# Patient Record
Sex: Male | Born: 1985 | Hispanic: Yes | Marital: Single | Smoking: Former smoker
Health system: Southern US, Community
[De-identification: ages and names within clinical notes are randomized; demographics above are authoritative.]

## PROBLEM LIST (undated history)

## (undated) DIAGNOSIS — R440 Auditory hallucinations: Secondary | ICD-10-CM

## (undated) HISTORY — PX: BACK SURGERY: SHX140

---

## 2014-11-10 HISTORY — PX: FRACTURE SURGERY: SHX138

## 2015-10-05 ENCOUNTER — Encounter (HOSPITAL_COMMUNITY)
Admission: EM | Disposition: A | Discharge status: Home / Self Care | Payer: Self-pay | Source: Home / Self Care | Attending: Orthopedic Surgery

## 2015-10-05 ENCOUNTER — Emergency Department (HOSPITAL_COMMUNITY): Payer: Self-pay | Admitting: Certified Registered"

## 2015-10-05 ENCOUNTER — Inpatient Hospital Stay (HOSPITAL_COMMUNITY)
Admission: EM | Admit: 2015-10-05 | Discharge: 2015-10-16 | DRG: 580 | Disposition: A | Discharge status: Home / Self Care | Payer: Self-pay | Attending: Orthopedic Surgery | Admitting: Orthopedic Surgery

## 2015-10-05 ENCOUNTER — Emergency Department (HOSPITAL_COMMUNITY): Payer: Self-pay

## 2015-10-05 ENCOUNTER — Emergency Department
Admission: EM | Admit: 2015-10-05 | Discharge: 2015-10-05 | Disposition: A | Discharge status: Other Acute Inpatient Hospital | Payer: Self-pay | Attending: Emergency Medicine | Admitting: Emergency Medicine

## 2015-10-05 ENCOUNTER — Encounter (HOSPITAL_COMMUNITY): Payer: Self-pay

## 2015-10-05 DIAGNOSIS — S61519A Laceration without foreign body of unspecified wrist, initial encounter: Secondary | ICD-10-CM | POA: Diagnosis present

## 2015-10-05 DIAGNOSIS — S6421XA Injury of radial nerve at wrist and hand level of right arm, initial encounter: Secondary | ICD-10-CM | POA: Diagnosis not present

## 2015-10-05 DIAGNOSIS — Y9289 Other specified places as the place of occurrence of the external cause: Secondary | ICD-10-CM | POA: Insufficient documentation

## 2015-10-05 DIAGNOSIS — Y9339 Activity, other involving climbing, rappelling and jumping off: Secondary | ICD-10-CM | POA: Insufficient documentation

## 2015-10-05 DIAGNOSIS — Y998 Other external cause status: Secondary | ICD-10-CM | POA: Insufficient documentation

## 2015-10-05 DIAGNOSIS — F17200 Nicotine dependence, unspecified, uncomplicated: Secondary | ICD-10-CM | POA: Diagnosis present

## 2015-10-05 DIAGNOSIS — T1491XA Suicide attempt, initial encounter: Secondary | ICD-10-CM

## 2015-10-05 DIAGNOSIS — W14XXXA Fall from tree, initial encounter: Secondary | ICD-10-CM | POA: Insufficient documentation

## 2015-10-05 DIAGNOSIS — R45851 Suicidal ideations: Secondary | ICD-10-CM

## 2015-10-05 DIAGNOSIS — F29 Unspecified psychosis not due to a substance or known physiological condition: Secondary | ICD-10-CM | POA: Insufficient documentation

## 2015-10-05 DIAGNOSIS — Z23 Encounter for immunization: Secondary | ICD-10-CM | POA: Insufficient documentation

## 2015-10-05 DIAGNOSIS — F333 Major depressive disorder, recurrent, severe with psychotic symptoms: Secondary | ICD-10-CM

## 2015-10-05 DIAGNOSIS — R440 Auditory hallucinations: Secondary | ICD-10-CM | POA: Diagnosis present

## 2015-10-05 DIAGNOSIS — X789XXA Intentional self-harm by unspecified sharp object, initial encounter: Secondary | ICD-10-CM | POA: Diagnosis present

## 2015-10-05 DIAGNOSIS — S61511A Laceration without foreign body of right wrist, initial encounter: Secondary | ICD-10-CM

## 2015-10-05 DIAGNOSIS — T1491 Suicide attempt: Secondary | ICD-10-CM | POA: Diagnosis present

## 2015-10-05 DIAGNOSIS — Z590 Homelessness: Secondary | ICD-10-CM

## 2015-10-05 DIAGNOSIS — S66801D Unspecified injury of other specified muscles, fascia and tendons at wrist and hand level, right hand, subsequent encounter: Secondary | ICD-10-CM

## 2015-10-05 DIAGNOSIS — F323 Major depressive disorder, single episode, severe with psychotic features: Secondary | ICD-10-CM | POA: Diagnosis present

## 2015-10-05 HISTORY — PX: I & D EXTREMITY: SHX5045

## 2015-10-05 HISTORY — DX: Auditory hallucinations: R44.0

## 2015-10-05 LAB — CBC
HCT: 45.5 %
HEMOGLOBIN: 15.1 g/dL
MCH: 27.8 pg
MCHC: 33.1 g/dL
MCV: 84 fL
PLATELETS: 242 10*3/uL
RBC: 5.42 MIL/uL
RDW: 15.1 %
WBC: 9.8 10*3/uL

## 2015-10-05 LAB — COMPREHENSIVE METABOLIC PANEL
ALK PHOS: 139 U/L
ALT: 14 U/L
AST: 23 U/L
Albumin: 4.6 g/dL
Anion gap: 10
BUN: 9 mg/dL
CALCIUM: 9.5 mg/dL
CO2: 22 mmol/L
CREATININE: 0.82 mg/dL
Chloride: 105 mmol/L
Glucose, Bld: 143 mg/dL
Potassium: 3.9 mmol/L
Sodium: 137 mmol/L
Total Bilirubin: 0.6 mg/dL
Total Protein: 7.4 g/dL

## 2015-10-05 LAB — ETHANOL

## 2015-10-05 LAB — SALICYLATE LEVEL

## 2015-10-05 LAB — ACETAMINOPHEN LEVEL: Acetaminophen (Tylenol), Serum: <10 ug/mL

## 2015-10-05 SURGERY — IRRIGATION AND DEBRIDEMENT EXTREMITY
Anesthesia: General | Site: Wrist | Laterality: Right

## 2015-10-05 MED ORDER — CEFAZOLIN SODIUM-DEXTROSE 2-3 GM-% IV SOLR
INTRAVENOUS | Status: DC | PRN
  Administered 2015-10-05: 2 g via INTRAVENOUS

## 2015-10-05 MED ORDER — TETANUS-DIPHTHERIA TOXOIDS TD 5-2 LFU IM INJ
INJECTION | INTRAMUSCULAR | Status: AC
  Filled 2015-10-05: qty 0.5

## 2015-10-05 MED ORDER — PROPOFOL 10 MG/ML IV BOLUS
INTRAVENOUS | Status: DC | PRN
  Administered 2015-10-05: 160 mg via INTRAVENOUS
  Administered 2015-10-05: 40 mg via INTRAVENOUS
  Administered 2015-10-06: 50 mg via INTRAVENOUS

## 2015-10-05 MED ORDER — MIDAZOLAM HCL 5 MG/5ML IJ SOLN
INTRAMUSCULAR | Status: DC | PRN
  Administered 2015-10-05: 2 mg via INTRAVENOUS

## 2015-10-05 MED ORDER — LACTATED RINGERS IV SOLN
INTRAVENOUS | Status: DC | PRN
  Administered 2015-10-05 – 2015-10-06 (×2): via INTRAVENOUS

## 2015-10-05 MED ORDER — ONDANSETRON HCL 4 MG/2ML IJ SOLN
INTRAMUSCULAR | Status: AC
  Filled 2015-10-05: qty 2

## 2015-10-05 MED ORDER — LIDOCAINE HCL (PF) 1 % IJ SOLN
30.0000 mL | Freq: Once | INTRAMUSCULAR | Status: AC
  Administered 2015-10-05: 30 mL
  Filled 2015-10-05: qty 30

## 2015-10-05 MED ORDER — SUCCINYLCHOLINE CHLORIDE 20 MG/ML IJ SOLN
INTRAMUSCULAR | Status: DC | PRN
  Administered 2015-10-05: 40 mg via INTRAVENOUS

## 2015-10-05 MED ORDER — DEXAMETHASONE SODIUM PHOSPHATE 4 MG/ML IJ SOLN
INTRAMUSCULAR | Status: DC | PRN
  Administered 2015-10-05: 4 mg via INTRAVENOUS

## 2015-10-05 MED ORDER — LIDOCAINE HCL (CARDIAC) 20 MG/ML IV SOLN
INTRAVENOUS | Status: DC | PRN
  Administered 2015-10-05: 40 mg via INTRAVENOUS

## 2015-10-05 MED ORDER — LIDOCAINE HCL (PF) 1 % IJ SOLN
INTRAMUSCULAR | Status: AC
  Administered 2015-10-05: 5 mL
  Filled 2015-10-05: qty 10

## 2015-10-05 MED ORDER — MIDAZOLAM HCL 2 MG/2ML IJ SOLN
INTRAMUSCULAR | Status: AC
  Filled 2015-10-05: qty 2

## 2015-10-05 MED ORDER — SUFENTANIL CITRATE 50 MCG/ML IV SOLN
INTRAVENOUS | Status: DC | PRN
  Administered 2015-10-05: 15 ug via INTRAVENOUS

## 2015-10-05 MED ORDER — CEFAZOLIN SODIUM 1-5 GM-% IV SOLN
1.0000 g | Freq: Once | INTRAVENOUS | Status: AC
  Administered 2015-10-05: 1 g via INTRAVENOUS
  Filled 2015-10-05: qty 50

## 2015-10-05 MED ORDER — DEXAMETHASONE SODIUM PHOSPHATE 4 MG/ML IJ SOLN
INTRAMUSCULAR | Status: AC
  Filled 2015-10-05: qty 1

## 2015-10-05 MED ORDER — SUFENTANIL CITRATE 50 MCG/ML IV SOLN
INTRAVENOUS | Status: AC
  Filled 2015-10-05: qty 1

## 2015-10-05 MED ORDER — ONDANSETRON HCL 4 MG/2ML IJ SOLN
INTRAMUSCULAR | Status: DC | PRN
  Administered 2015-10-05: 4 mg via INTRAVENOUS

## 2015-10-05 MED ORDER — TETANUS-DIPHTH-ACELL PERTUSSIS 5-2.5-18.5 LF-MCG/0.5 IM SUSP
0.5000 mL | Freq: Once | INTRAMUSCULAR | Status: AC
  Administered 2015-10-05: 0.5 mL via INTRAMUSCULAR

## 2015-10-05 SURGICAL SUPPLY — 54 items
BANDAGE ELASTIC 4 VELCRO ST LF (GAUZE/BANDAGES/DRESSINGS) ×3 IMPLANT
BNDG CONFORM 2 STRL LF (GAUZE/BANDAGES/DRESSINGS) ×3 IMPLANT
BNDG GAUZE ELAST 4 BULKY (GAUZE/BANDAGES/DRESSINGS) ×3 IMPLANT
CANISTER SUCTION 2500CC (MISCELLANEOUS) ×6 IMPLANT
CONNECTOR NERVE AXOGUARD 2X15 (Orthopedic Implant) ×3 IMPLANT
CORDS BIPOLAR (ELECTRODE) ×3 IMPLANT
CUFF TOURNIQUET SINGLE 18IN (TOURNIQUET CUFF) ×3 IMPLANT
CUFF TOURNIQUET SINGLE 24IN (TOURNIQUET CUFF) IMPLANT
DRSG ADAPTIC 3X8 NADH LF (GAUZE/BANDAGES/DRESSINGS) ×3 IMPLANT
GAUZE SPONGE 4X4 12PLY STRL (GAUZE/BANDAGES/DRESSINGS) ×3 IMPLANT
GAUZE XEROFORM 1X8 LF (GAUZE/BANDAGES/DRESSINGS) IMPLANT
GAUZE XEROFORM 5X9 LF (GAUZE/BANDAGES/DRESSINGS) ×3 IMPLANT
GLOVE BIO SURGEON STRL SZ7.5 (GLOVE) ×3 IMPLANT
GLOVE BIOGEL M STRL SZ7.5 (GLOVE) ×6 IMPLANT
GLOVE SS BIOGEL STRL SZ 8 (GLOVE) ×3 IMPLANT
GLOVE SUPERSENSE BIOGEL SZ 8 (GLOVE) ×6
GOWN STRL REUS W/ TWL LRG LVL3 (GOWN DISPOSABLE) ×3 IMPLANT
GOWN STRL REUS W/ TWL XL LVL3 (GOWN DISPOSABLE) IMPLANT
GOWN STRL REUS W/TWL LRG LVL3 (GOWN DISPOSABLE) ×6
GOWN STRL REUS W/TWL XL LVL3 (GOWN DISPOSABLE)
HANDPIECE INTERPULSE COAX TIP (DISPOSABLE)
KIT BASIN OR (CUSTOM PROCEDURE TRAY) ×3 IMPLANT
KIT ROOM TURNOVER OR (KITS) ×3 IMPLANT
MANIFOLD NEPTUNE II (INSTRUMENTS) IMPLANT
NEEDLE HYPO 25GX1X1/2 BEV (NEEDLE) IMPLANT
NS IRRIG 1000ML POUR BTL (IV SOLUTION) ×3 IMPLANT
PACK ORTHO EXTREMITY (CUSTOM PROCEDURE TRAY) ×3 IMPLANT
PAD ARMBOARD 7.5X6 YLW CONV (MISCELLANEOUS) ×3 IMPLANT
PAD CAST 3X4 CTTN HI CHSV (CAST SUPPLIES) ×1 IMPLANT
PAD CAST 4YDX4 CTTN HI CHSV (CAST SUPPLIES) ×1 IMPLANT
PADDING CAST COTTON 3X4 STRL (CAST SUPPLIES) ×2
PADDING CAST COTTON 4X4 STRL (CAST SUPPLIES) ×2
SET CYSTO W/LG BORE CLAMP LF (SET/KITS/TRAYS/PACK) ×3 IMPLANT
SET HNDPC FAN SPRY TIP SCT (DISPOSABLE) IMPLANT
SPLINT FIBERGLASS 4X30 (CAST SUPPLIES) ×3 IMPLANT
SPONGE GAUZE 4X4 12PLY STER LF (GAUZE/BANDAGES/DRESSINGS) ×3 IMPLANT
SPONGE LAP 4X18 X RAY DECT (DISPOSABLE) ×3 IMPLANT
SUT FIBERWIRE 3-0 18 TAPR NDL (SUTURE) ×18
SUT PROLENE 4 0 PS 2 18 (SUTURE) ×3 IMPLANT
SUT PROLENE 5 0 PS 2 (SUTURE) ×9 IMPLANT
SUT PROLENE 6 0 CC (SUTURE) ×3 IMPLANT
SUT VIC AB 2-0 FS1 27 (SUTURE) ×3 IMPLANT
SUT VIC AB 4-0 P-3 18X BRD (SUTURE) ×1 IMPLANT
SUT VIC AB 4-0 P3 18 (SUTURE) ×2
SUTURE FIBERWR 3-0 18 TAPR NDL (SUTURE) ×6 IMPLANT
SYR CONTROL 10ML LL (SYRINGE) IMPLANT
SYSTEM CHEST DRAIN TLS 7FR (DRAIN) ×3 IMPLANT
TOWEL OR 17X24 6PK STRL BLUE (TOWEL DISPOSABLE) ×3 IMPLANT
TOWEL OR 17X26 10 PK STRL BLUE (TOWEL DISPOSABLE) ×3 IMPLANT
TUBE ANAEROBIC SPECIMEN COL (MISCELLANEOUS) IMPLANT
TUBE CONNECTING 12'X1/4 (SUCTIONS) ×1
TUBE CONNECTING 12X1/4 (SUCTIONS) ×2 IMPLANT
WATER STERILE IRR 1000ML POUR (IV SOLUTION) IMPLANT
YANKAUER SUCT BULB TIP NO VENT (SUCTIONS) IMPLANT

## 2015-10-05 NOTE — ED Notes (Signed)
Patient presents to the ED with large laceration across his right wrist currently bleeding.  Patient appears pale and diaphoretic.  Patient brought to the ED by couple who state they do not know patient but someone told them that he needed help, that he was homeless and that he intentionally cut himself with a saw.  Patient refused to give his name.  Patient alert, eyes open and shaking his head or nodding in answer to some questions.  Patient states he does not have a license or wallet on him.  Patient avoiding eye contact.

## 2015-10-05 NOTE — ED Notes (Signed)
Report called to eric rn, charge nurse at [facility].  Pt calm and cooperative.  Pt is IVC, transferred by [place] sheriff dept.  No urine sample obtained at [facility].  Pt was unable to void.  Iv was removed.

## 2015-10-05 NOTE — ED Notes (Signed)
Pt transfer from [facility] emergency department for self inflicted laceration to right arm. Pt is having thoughts of harming himself at this time,

## 2015-10-05 NOTE — H&P (Signed)
Stephen [name] is an 29 y.o. male.   Chief Complaint: Laceration right wrist HPI: Patient presented to laceration to the right wrist. He tried injured himself. He states he was depressed. He states he is now not depressed and wants to get his arm fixed. Mental health as being consult by the emergency room department. I do not see consult on the chart at this time.  I discussed all issues with patient at length. It appears this happened sometime ago. I was called around 9 PM in regards to this patient.  He denies other injury. He denies neck back chest or abdominal pain.  He takes no medicines.  He has no allergies  He is alert and oriented. He does not have family with him.    Past Medical History  Diagnosis Date  . Hearing voices     Pt does not know exactly what it is but he does hear voices    Past Surgical History  Procedure Laterality Date  . Fracture surgery Right 2016    right lower leg - August     No family history on file. Social History:  reports that he has been smoking.  He has never used smokeless tobacco. He reports that he does not drink alcohol or use illicit drugs.  Allergies: No Known Allergies   (Not in a hospital admission)  Results for orders placed or performed during the hospital encounter of 10/05/15 (from the past 48 hour(s))  Comprehensive metabolic panel     Status: None   Collection Time: 10/05/15  3:00 PM  Result Value Ref Range   Sodium 137 mmol/L   Potassium 3.9 mmol/L   Chloride 105 mmol/L   CO2 22 mmol/L   Glucose, Bld 143 mg/dL   BUN 9 mg/dL   Creatinine, Ser 0.82 mg/dL   Calcium 9.5 mg/dL   Total Protein 7.4 g/dL   Albumin 4.6 g/dL   AST 23 U/L   ALT 14 U/L   Alkaline Phosphatase 139 U/L   Total Bilirubin 0.6 mg/dL   GFR calc non Af Amer NOT CALCULATED mL/min   GFR calc Af Amer NOT CALCULATED mL/min    Comment: (NOTE) The eGFR has been calculated using the CKD EPI equation. This calculation has not been validated in all  clinical situations. eGFR's persistently <60 mL/min signify possible Chronic Kidney Disease.    Anion gap 10   Ethanol (ETOH)     Status: None   Collection Time: 10/05/15  3:00 PM  Result Value Ref Range   Alcohol, Ethyl (B) <5 mg/dL    Comment:        LOWEST DETECTABLE LIMIT FOR SERUM ALCOHOL IS 5 mg/dL FOR MEDICAL PURPOSES ONLY   Salicylate level     Status: None   Collection Time: 10/05/15  3:00 PM  Result Value Ref Range   Salicylate Lvl <6.0 mg/dL  Acetaminophen level     Status: None   Collection Time: 10/05/15  3:00 PM  Result Value Ref Range   Acetaminophen (Tylenol), Serum <10 ug/mL    Comment:        THERAPEUTIC CONCENTRATIONS VARY SIGNIFICANTLY. A RANGE OF 10-30 ug/mL MAY BE AN EFFECTIVE CONCENTRATION FOR MANY PATIENTS. HOWEVER, SOME ARE BEST TREATED AT CONCENTRATIONS OUTSIDE THIS RANGE. ACETAMINOPHEN CONCENTRATIONS >150 ug/mL AT 4 HOURS AFTER INGESTION AND >50 ug/mL AT 12 HOURS AFTER INGESTION ARE OFTEN ASSOCIATED WITH TOXIC REACTIONS.   CBC     Status: None   Collection Time: 10/05/15  3:00 PM  Result  Value Ref Range   WBC 9.8 K/uL   RBC 5.42 MIL/uL   Hemoglobin 15.1 g/dL   HCT 45.5 %   MCV 84.0 fL   MCH 27.8 pg   MCHC 33.1 g/dL   RDW 15.1 %   Platelets 242 K/uL   Dg Wrist Complete Right  10/05/2015  CLINICAL DATA:  Laceration, right wrist pain EXAM: RIGHT WRIST - COMPLETE 3+ VIEW COMPARISON:  None. FINDINGS: Four views of the right wrist submitted. No acute fracture or subluxation. There is soft tissue air and skin defect probable laceration radial aspect of the wrist. IMPRESSION: No acute fracture or subluxation. Soft tissue injury probable laceration radial aspect of the wrist Electronically Signed   By: Lahoma Crocker M.D.   On: 10/05/2015 21:41    Review of Systems  Constitutional: Negative.   Eyes: Negative.   Respiratory: Negative.   Gastrointestinal: Negative.   Genitourinary: Negative.   Musculoskeletal: Negative.   Endo/Heme/Allergies:  Negative.   Psychiatric/Behavioral:       Patient was depressed and tried injured himself with the saw today    Blood pressure 120/92, pulse 104, temperature 98.6 F (37 C), temperature source Oral, resp. rate 17, SpO2 99 %. Physical Exam laceration transverse right wrist with exposed median nerve FPL FCR palmaris as well as the APL and EPB tendons  He has laceration to the superficial portion of his radial artery and superficial radial nerve. I've debrided this worse this out and administered lidocaine without epinephrine so as to do so and explored.  I would recommend thorough operative IND and repair of structures as necessary to give him the best repair possible. He understands this and will proceed accordingly.  He states he does not feel as though he wants to hurt himself anymore and I did relay this/document this with nursing staff present through the [place] interpreter The patient is alert and oriented in no acute distress. The patient complains of pain in the affected upper extremity.  The patient is noted to have a normal HEENT exam. Lung fields show equal chest expansion and no shortness of breath. Abdomen exam is nontender without distention. Lower extremity examination does not show any fracture dislocation or blood clot symptoms. Pelvis is stable and the neck and back are stable and nontender. Assessment/Plan Self-inflicted laceration right wrist with median nerve flexor tendon and superficial radial nerve injury. We will plan for irrigation and debridement as well as repair of the superficial radial nerve, thenar branch to the median nerve, FPL, FCR and associated first dorsal compartment tendons as necessary.  I've spoken with the ER physicians. They will contact the mental health professionals. I will plan to admit him overnight for convenience on the patient's behalf postoperatively  I discussed with the patient all issues   We are planning surgery for your upper  extremity. The risk and benefits of surgery to include risk of bleeding, infection, anesthesia,  damage to normal structures and failure of the surgery to accomplish its intended goals of relieving symptoms and restoring function have been discussed in detail. With this in mind we plan to proceed. I have specifically discussed with the patient the pre-and postoperative regime and the dos and don'ts and risk and benefits in great detail. Risk and benefits of surgery also include risk of dystrophy(CRPS), chronic nerve pain, failure of the healing process to go onto completion and other inherent risks of surgery The relavent the pathophysiology of the disease/injury process, as well as the alternatives for treatment and  postoperative course of action has been discussed in great detail with the patient who desires to proceed.  We will do everything in our power to help you (the patient) restore function to the upper extremity. It is a pleasure to see this patient today.  Paulene Floor 10/05/2015, 10:38 PM

## 2015-10-05 NOTE — ED Provider Notes (Signed)
CSN: [id number]     Arrival date & time 10/05/15  2014 History   First MD Initiated Contact with Patient 10/05/15 2025     Chief Complaint  Patient presents with  . Laceration  . Suicidal     (Consider location/radiation/quality/duration/timing/severity/associated sxs/prior Treatment) A language interpreter was used.   29 year old male presents as transfer from [place] secondary to suicide attempt where he cut his right wrist and severed multiple tendons in the process. He has weakness to flexion and extension of his right hand also with abduction and abduction. No other injuries. Patient states that the voices in his head told him to do it. Has history of psychosis and suicidal attempts in the past.no other injuries elsewhere. No ingestions.  Past Medical History  Diagnosis Date  . Hearing voices     Pt does not know exactly what it is but he does hear voices   Past Surgical History  Procedure Laterality Date  . Fracture surgery Right 2016    right lower leg - August   . Back surgery  August    History reviewed. No pertinent family history. Social History  Substance Use Topics  . Smoking status: Current Some Day Smoker  . Smokeless tobacco: Never Used  . Alcohol Use: No    Review of Systems  Constitutional: Negative for fever and chills.  HENT: Negative for congestion.   Eyes: Negative for redness.  Respiratory: Negative for cough and shortness of breath.   Cardiovascular: Negative for chest pain.  Skin: Positive for wound. Negative for pallor.  All other systems reviewed and are negative.     Allergies  Review of patient's allergies indicates no known allergies.  Home Medications   Prior to Admission medications   Not on File   BP 120/92 mmHg  Pulse 104  Temp(Src) 98.6 F (37 C) (Oral)  Resp 17  SpO2 99% Physical Exam  Constitutional: He is oriented to person, place, and time. He appears well-developed and well-nourished.  HENT:  Head: Normocephalic  and atraumatic.  Neck: Normal range of motion.  Cardiovascular: Normal rate.   Pulmonary/Chest: Effort normal. No respiratory distress.  Abdominal: Soft. He exhibits no distension.  Musculoskeletal: Normal range of motion.  Laceration to volar side of right wrist towards radial aspect, decreased flexion of wrist and decreased strength with radial adduction as well.  Neurological: He is alert and oriented to person, place, and time.  Skin: Skin is warm and dry.  Psychiatric: He has a normal mood and affect. He is slowed and withdrawn. He expresses suicidal ideation.  Nursing note and vitals reviewed.   ED Course  Procedures (including critical care time) Labs Review Labs Reviewed - No data to display  Imaging Review Dg Wrist Complete Right  10/05/2015  CLINICAL DATA:  Laceration, right wrist pain EXAM: RIGHT WRIST - COMPLETE 3+ VIEW COMPARISON:  None. FINDINGS: Four views of the right wrist submitted. No acute fracture or subluxation. There is soft tissue air and skin defect probable laceration radial aspect of the wrist. IMPRESSION: No acute fracture or subluxation. Soft tissue injury probable laceration radial aspect of the wrist Electronically Signed   By: Natasha Mead M.D.   On: 10/05/2015 21:41   I have personally reviewed and evaluated these images and lab results as part of my medical decision-making.   EKG Interpretation None      MDM   Final diagnoses:  Suicide attempt [facility])  Injury of flexor tendon of hand, right, subsequent encounter   29 year old  male with an extensive wrist laceration involving multiple tendons. T Dapp updated at outside facility. X-ray here without any broken bones. Orthopedics consulted for evaluation and repairand will take the OR in order to get the best outcome. Patient still needs assessment for suicide attempt picking it as an inpatient.     Marily MemosJason Mirl Hillery, MD 10/06/15 [place] [id number]

## 2015-10-05 NOTE — ED Notes (Signed)
Pt says that the reason he broke his back and his leg were because he jumped out of a tree to kill himself.

## 2015-10-05 NOTE — ED Notes (Addendum)
Hydrographic surveyorHand surgeon at bedside - translator at bedside   Sec

## 2015-10-05 NOTE — Anesthesia Preprocedure Evaluation (Addendum)
Anesthesia Evaluation  Patient identified by MRN, date of birth, ID band Patient awake    Reviewed: Allergy & Precautions, NPO status , Patient's Chart, lab work & pertinent test results  History of Anesthesia Complications Negative for: history of anesthetic complications  Airway Mallampati: II  TM Distance: >3 FB Neck ROM: Full    Dental  (+) Teeth Intact   Pulmonary neg shortness of breath, neg sleep apnea, neg COPD, neg recent URI, Current Smoker, neg PE   breath sounds clear to auscultation       Cardiovascular negative cardio ROS   Rhythm:Regular     Neuro/Psych PSYCHIATRIC DISORDERS negative neurological ROS     GI/Hepatic negative GI ROS, Neg liver ROS,   Endo/Other  negative endocrine ROS  Renal/GU negative Renal ROS     Musculoskeletal   Abdominal   Peds  Hematology negative hematology ROS (+)   Anesthesia Other Findings   Reproductive/Obstetrics                            Anesthesia Physical Anesthesia Plan  ASA: II and emergent  Anesthesia Plan: General   Post-op Pain Management:    Induction: Intravenous, Rapid sequence and Cricoid pressure planned  Airway Management Planned: Oral ETT  Additional Equipment: None  Intra-op Plan:   Post-operative Plan: Extubation in OR  Informed Consent: I have reviewed the patients History and Physical, chart, labs and discussed the procedure including the risks, benefits and alternatives for the proposed anesthesia with the patient or authorized representative who has indicated his/her understanding and acceptance.   Dental advisory given  Plan Discussed with: CRNA and Surgeon  Anesthesia Plan Comments:         Anesthesia Quick Evaluation

## 2015-10-05 NOTE — ED Notes (Addendum)
md in with pt for suture repair, tendons cut.  Er md to call ortho for repair.  Pt cooperative.  Sitter with pt.  Bpd at bedside.

## 2015-10-05 NOTE — ED Provider Notes (Signed)
Time Seen: Approximately 1525  I have reviewed the triage notes  Chief Complaint: Suicide Attempt and Extremity Laceration   History of Present Illness: Stephen [name] is a 29 y.o. male who is exclusively Spanish-speaking with history, review of systems, disposition, exam, etc. taken through [place] interpreter. Patient apparently has a history of psychosis and had 1 previous suicide attempt where he jumped out of a tree and [facility] and right foot. She was discharged from there and ended up at a group home here locally. He states he is on medications but he does not know the names, pharmacy, or have current access to the listed medications. Patient states that while he was at the group home he had suicidal thoughts and lacerated his right wrist with a hand wood saw. The patient denies any other injuries and states he is having some auditory hallucinations of voices telling him to hurt himself.  Patient is right-handed   No past medical history on file.  There are no active problems to display for this patient.   No past surgical history on file.  No past surgical history on file.  No current outpatient prescriptions on file.  Allergies:  Review of patient's allergies indicates not on file.  Family History: No family history on file.  Social History: Social History  Substance Use Topics  . Smoking status: Not on file  . Smokeless tobacco: Not on file  . Alcohol Use: Not on file     Review of Systems:   10 point review of systems was performed and was otherwise negative:  Constitutional: No fever Eyes: No visual disturbances ENT: No sore throat, ear pain Cardiac: No chest pain Respiratory: No shortness of breath, wheezing, or stridor Abdomen: No abdominal pain, no vomiting, No diarrhea Endocrine: No weight loss, No night sweats Extremities: No peripheral edema, cyanosis Skin: No rashes, easy bruising Neurologic:  No focal weakness, trouble with speech or swollowing Urologic: No dysuria, Hematuria, or urinary frequency   Physical Exam:  ED Triage Vitals  Enc Vitals Group     BP 10/05/15 1521 108/80 mmHg     Pulse Rate 10/05/15 1521 73     Resp 10/05/15 1521 18     Temp 10/05/15 1521 97.9 F (36.6 C)     Temp Source 10/05/15 1521 Oral     SpO2 10/05/15 1521 98 %     Weight 10/05/15 1521 133 lb 1.6 oz (60.374 kg)     Height 10/05/15 1521 5\' 6"  (1.676 m)     Head Cir --      Peak Flow --      Pain Score --      Pain Loc --      Pain Edu? --      Excl. in GC? --     General: Awake , Alert , and Oriented times 3; GCS 15 Head: Normal cephalic , atraumatic Eyes: Pupils equal , round, reactive to light Nose/Throat: No nasal drainage, patent upper airway without erythema or exudate.  Neck: Supple, Full range of motion, No anterior adenopathy or palpable thyroid masses Lungs: Clear to ascultation without wheezes , rhonchi, or rales Heart: Regular rate, regular rhythm without murmurs , gallops , or rubs Abdomen: Soft, non tender without rebound, guarding , or rigidity; bowel sounds positive and symmetric in all 4 quadrants. No organomegaly .        Extremities: Examination of the patient's right wrist after the wounds were  cleaned. Shows what appears to be 2 tendon lacerations the palmaris longus and likely that the right flexor  carpi radialis.  The patient has some strength to oppose the thumb. Median nerve deficit is difficult to assess but none obviously present. No vascular involvement with good radial pulse and less than 2 second capillary refill.  Neurologic: normal ambulation, Motor symmetric without deficits, sensory intact Skin: warm, dry, no rashes   Labs:   All laboratory work was reviewed including any pertinent negatives or positives listed below:  Labs Reviewed  COMPREHENSIVE METABOLIC PANEL  ETHANOL  SALICYLATE LEVEL  ACETAMINOPHEN LEVEL  CBC  URINE DRUG SCREEN, QUALITATIVE  ([facility] ONLY)      Procedures:  Patient's right wrist wound was cleaned and dressed and was briefly explored at the bedside with identification of the above-mentioned injuries.  Patient had involuntary commitment paperwork filled out with psychiatric consultation ED Course:  I reviewed the findings with the orthopedic surgeon on call and with the tendon involvement felt a hand specialist was necessary for closure especially with flexor tendon involvement. The patient is otherwise hemodynamically stable and has not been actively psychotic here in emergency department and cooperative. The patient's case was reviewed with the [facility] emergency department due to the hand injuries along with his psychiatric component. Agreed to accept the patient in the emergency department and disposition accordingly.    Assessment:  Acute psychosis Suicidal ideation Dominant hand laceration with tendon involvement  Final Clinical Impression:  Final diagnoses:  Suicidal ideation  Laceration of wrist with complication, right, initial encounter     Plan:  Transfer to the [facility] emergency department            Jennye Moccasin, MD 10/05/15 [phone]

## 2015-10-05 NOTE — BH Assessment (Addendum)
Assessment Note  Stephen [name] is an 29 y.o. male who presents to the ER due cutting his arm. Patient is a Spanish speaking individual, who requires an interpreter. Patient was very guarded and suspicious of ER staff.  According to the Group Home Administrator Steward Drone(Brenda Torrian-[phone]), patient moved into their facility, "[facility]," approximately 2 months ago. He transferred to them, from [facility]. Per the Group Home, she had a [facility] with [facility], to keep the patient in their facility for two months, as a means to ensure he had somewhere safe to live, until he healed. What lead to the patient being [facility], was he broke both of his legs. He was running from, "the people" who was trying to apprehend him, to deport him back to [place]. He was on the roof, of a tall building. He attempted to jump down onto a tree, to get down to the ground. He was unsuccessful. It is unclear if he landed in the tree or missed the tree but it resulted in him breaking both of his legs.  Group Home administrator further explained, per the contract [facility], his last day with the Group Home was 09/28/2015. "In the contract," it instructed her to "drop him off at the nearest shelter." However, he requested to stay until after Thanksgiving, so he could spend the holiday with them. On yesterday, the patient sat at the table but didn't eat anything. When the other residents finished, the patient left the home. He came back later that night. On this morning, he left the Group Home and staff thought he left, to go to the Shelter. However, this afternoon, the Group Home staff, saw him in the backyard, bent over. When they walked up to him, that's when she noticed the patient was bleeding. From that point, the Group Home Administrator was called and she was the one who brought him to the ER.  Patient told ER Staff, he took a saw from under the house of the Group Home, to  cut his arm.  Patient asked the Administrator, if she could make sure he doesn't get deported back to [place]. She states, she told him, it was out of her control and she was unable to do anything about that.  Per Group Home Administrator, the patient was assigned a Child psychotherapistocial Worker, from [employer] Ton[facility] Fail(Wendy [phone]). Group Home reported, they have called and left her a message, about the events that took place today.  Diagnosis: Depression  Past Medical History: No past medical history on file.  No past surgical history on file.  Family History: No family history on file.  Social History:  has no tobacco, alcohol, and drug history on file.  Additional Social History:  Alcohol / Drug Use Pain Medications: See PTA Prescriptions: See PTA Over the Counter: See PTA History of alcohol / drug use?:  (Unknown) Longest period of sobriety (when/how long): Unknown Negative Consequences of Use:  (Unknown) Withdrawal Symptoms:  (None reported)  CIWA: CIWA-Ar BP: 117/86 mmHg Pulse Rate: 78 COWS:    Allergies: Allergies not on file  Home Medications:  (Not in a hospital admission)  OB/GYN Status:  No LMP for male patient.  General Assessment Data [facility] ED TTS Assessment: In system Is this a Tele or Face-to-Face Assessment?: Face-to-Face Is this an Initial Assessment or a Re-assessment for this encounter?: Initial Assessment Marital status: Single Maiden name: n/a Is patient pregnant?: No Pregnancy Status: No Living Arrangements: Other (  Comment) (Homeless, Recently discharged from Group Home) Admission Status: Other (Comment) Is patient capable of signing voluntary admission?: No Referral Source: Self/Family/Friend Insurance type: None  Medical Screening Exam [facility] Walk-in ONLY) Medical Exam completed: Yes  Crisis Care Plan Living Arrangements: Other (Comment) (Homeless, Recently discharged from Group Home) Name of Psychiatrist:  None Name of Therapist: None  Education Status Is patient currently in school?: No Current Grade: n/a Highest grade of school patient has completed: Unknown Name of school: Unknown Contact person: n/a  Risk to self with the past 6 months Suicidal Ideation: Yes-Currently Present Suicidal Intent: Yes-Currently Present Has patient had any suicidal intent within the past 6 months prior to admission? : Yes Is patient at risk for suicide?: Yes Suicidal Plan?: Yes-Currently Present Has patient had any suicidal plan within the past 6 months prior to admission? : Yes Specify Current Suicidal Plan: Cut his self Access to Means: Yes Specify Access to Suicidal Means: It was reported, patient used a saw What has been your use of drugs/alcohol within the last 12 months?: Unknown Previous Attempts/Gestures: Yes How many times?: 1 (1 known time) Other Self Harm Risks: n/a Triggers for Past Attempts: Other (Comment) (Fear of being deported, back to [place]) Intentional Self Injurious Behavior: Cutting, Damaging Comment - Self Injurious Behavior: Right arm Family Suicide History: Unknown Recent stressful life event(s): Other (Comment), Loss (Comment), Legal Issues (Fear of being deported, back to [place]) Persecutory voices/beliefs?: No Depression: Yes Depression Symptoms: Feeling angry/irritable, Feeling worthless/self pity, Loss of interest in usual pleasures, Isolating, Fatigue, Guilt, Tearfulness, Despondent Substance abuse history and/or treatment for substance abuse?: No (None Known) Suicide prevention information given to non-admitted patients: Not applicable  Risk to Others within the past 6 months Homicidal Ideation: No Does patient have any lifetime risk of violence toward others beyond the six months prior to admission? : No Thoughts of Harm to Others: No Current Homicidal Intent: No Current Homicidal Plan: No Access to Homicidal Means: No Identified Victim: None Reported History of  harm to others?: No (None Reported) Assessment of Violence: None Noted Violent Behavior Description: Unknown Does patient have access to weapons?: No Criminal Charges Pending?: No Does patient have a court date: No Is patient on probation?: No  Psychosis Hallucinations: None noted Delusions: None noted  Mental Status Report Appearance/Hygiene: Unremarkable Eye Contact: Fair Motor Activity: Unable to assess (Patient was lying in the bed) Speech: Soft, Slow, Language other than English (Interpreter was used) Level of Consciousness: Alert Mood: Anxious, Suspicious, Fearful, Terrified Affect: Appropriate to circumstance, Anxious Anxiety Level: Severe Thought Processes: Unable to Assess Judgement: Impaired Orientation: Person, Situation Obsessive Compulsive Thoughts/Behaviors: Severe  Cognitive Functioning Concentration: Unable to Assess Memory: Unable to Assess (Patient was guarded and suspicious of ER staff) IQ: Average Insight: Poor Impulse Control: Poor Appetite:  (Unknown) Weight Loss: 0 Weight Gain: 0 Sleep: Unable to Assess Total Hours of Sleep:  (Unknown) Vegetative Symptoms: None  ADLScreening [facility] Assessment Services) Patient's cognitive ability adequate to safely complete daily activities?: Yes Patient able to express need for assistance with ADLs?: Yes Independently performs ADLs?: Yes (appropriate for developmental age)  Prior Inpatient Therapy Prior Inpatient Therapy: Yes Prior Therapy Dates: 06/2015 Prior Therapy Facilty/Provider(s): [facility] Reason for Treatment: Broke his legs  Prior Outpatient Therapy Prior Outpatient Therapy: No (Unknown Mental Health History) Prior Therapy Dates: Unknown Prior Therapy Facilty/Provider(s): Unknown Reason for Treatment: Unknown Does patient have an ACCT team?: Unknown Does patient have Intensive In-House Services?  : Unknown Does patient have  [facility] services? : Unknown Does patient have P4CC  services?: Unknown  ADL Screening (condition at time of admission) Patient's cognitive ability adequate to safely complete daily activities?: Yes Is the patient deaf or have difficulty hearing?: No Does the patient have difficulty seeing, even when wearing glasses/contacts?: No Does the patient have difficulty concentrating, remembering, or making decisions?: No Patient able to express need for assistance with ADLs?: Yes Does the patient have difficulty dressing or bathing?: No Independently performs ADLs?: Yes (appropriate for developmental age) Does the patient have difficulty walking or climbing stairs?: No Weakness of Legs: None Weakness of Arms/Hands: None  Home Assistive Devices/Equipment Home Assistive Devices/Equipment: None  Therapy Consults (therapy consults require a physician order) PT Evaluation Needed: No OT Evalulation Needed: No SLP Evaluation Needed: No Abuse/Neglect Assessment (Assessment to be complete while patient is alone) Physical Abuse:  (Unknown) Verbal Abuse:  (Unknown) Sexual Abuse:  (Unknown) Exploitation of patient/patient's resources:  (Unknown) Self-Neglect:  (Unknown) Possible abuse reported to::  (Unknown) Values / Beliefs Cultural Requests During Hospitalization:  (Unknown) Spiritual Requests During Hospitalization:  (Unknown) Consults Spiritual Care Consult Needed:  (Unknown) Social Work Consult Needed:  (Unknown)      Additional Information 1:1 In Past 12 Months?: No CIRT Risk: No Elopement Risk: No Does patient have medical clearance?: No  Child/Adolescent Assessment Running Away Risk: Denies (Patient is an adult)  Disposition:  Disposition Initial Assessment Completed for this Encounter: Yes Disposition of Patient: Referred to (Patient's isn't medically cleared, being transferred medical) Patient referred to: Other (Comment) (Patient's isn't medically cleared, being transferred medical)  On Site Evaluation by:   Reviewed with  Physician:     Lilyan Gilford, MS, LCAS, LPC, NCC, CCSI 10/05/2015 8:07 PM

## 2015-10-05 NOTE — ED Notes (Signed)
Resumed care from [employer] rn.  Pt alert.  Iv in place.  Skin warm and dry.  Bleeding controlled to right wrist with pressure bandage.  Sitter with pt.  Pt speaks some english.  Bpd also in with pt.  Pt calm and cooperative.

## 2015-10-05 NOTE — ED Notes (Signed)
Right wirist dressed with saline gauze.   Sitter with pt.  Pt calm and cooperative.

## 2015-10-05 NOTE — ED Notes (Addendum)
Pt states "I made a cut on my hand, because I wanted to commit suicide. I am having trouble with work" Pt does state that he hearing things that aren't there

## 2015-10-05 NOTE — ED Notes (Signed)
Bleeding controlled of right wrist with pressure bandage.  Pt calm and cooperative.  Vital signs stable.   Sitter with pt.

## 2015-10-05 NOTE — Anesthesia Procedure Notes (Signed)
Procedure Name: Intubation Date/Time: 10/05/2015 11:10 PM Performed by: Melina SchoolsBANKS, Kalii Chesmore J Pre-anesthesia Checklist: Patient identified, Emergency Drugs available, Suction available, Patient being monitored and Timeout performed Patient Re-evaluated:Patient Re-evaluated prior to inductionOxygen Delivery Method: Circle system utilized Preoxygenation: Pre-oxygenation with 100% oxygen Intubation Type: IV induction, Rapid sequence and Cricoid Pressure applied Ventilation: Mask ventilation without difficulty Laryngoscope Size: Mac and 3 Grade View: Grade I Tube type: Oral Tube size: 8.0 mm Number of attempts: 1 Airway Equipment and Method: Stylet Placement Confirmation: ETT inserted through vocal cords under direct vision,  positive ETCO2 and breath sounds checked- equal and bilateral Secured at: 24 cm Tube secured with: Tape Dental Injury: Teeth and Oropharynx as per pre-operative assessment

## 2015-10-05 NOTE — ED Notes (Signed)
Consent signed with surgeon and anestesiology at bedside

## 2015-10-05 NOTE — ED Notes (Signed)
Pt in custody of [place] [employer].

## 2015-10-05 NOTE — ED Notes (Signed)
Pt returned from xray

## 2015-10-05 NOTE — ED Notes (Addendum)
[facility] called to do assessment on pt - but pt had already been wheeled up to surgery.  Once the pt is stable and out of surgery a [facility] tele psych assessment needs to be completed on the pt.

## 2015-10-05 NOTE — ED Notes (Signed)
MD at bedside. 

## 2015-10-05 NOTE — ED Notes (Signed)
Translator on phone  

## 2015-10-05 NOTE — BH Assessment (Signed)
Received call from Dr. Marily MemosJason Mesner asking if TTS assessment could be completed before Pt went to surgery. Informed Dr. Clayborne DanaMesner Pt could be seen in ten minutes, which he said was good. Called RN within ten minutes and was told Pt was rushed into surgery. Requested TTS be contacted when Pt was ready for assessment.   Harlin RainFord Ellis Patsy BaltimoreWarrick Jr, LPC, [facility], [facility] Triage Specialist [phone]

## 2015-10-06 DIAGNOSIS — R443 Hallucinations, unspecified: Secondary | ICD-10-CM

## 2015-10-06 DIAGNOSIS — F29 Unspecified psychosis not due to a substance or known physiological condition: Secondary | ICD-10-CM

## 2015-10-06 DIAGNOSIS — S61519A Laceration without foreign body of unspecified wrist, initial encounter: Secondary | ICD-10-CM | POA: Diagnosis present

## 2015-10-06 MED ORDER — MORPHINE SULFATE (PF) 2 MG/ML IV SOLN: 1.0000 mg | INTRAVENOUS | Status: DC | PRN

## 2015-10-06 MED ORDER — LACTATED RINGERS IV SOLN: INTRAVENOUS | Status: DC

## 2015-10-06 MED ORDER — DOCUSATE SODIUM 100 MG PO CAPS
100.0000 mg | ORAL_CAPSULE | Freq: Two times a day (BID) | ORAL | Status: DC
  Administered 2015-10-06 – 2015-10-16 (×20): 100 mg via ORAL
  Filled 2015-10-06 (×20): qty 1

## 2015-10-06 MED ORDER — CEFAZOLIN SODIUM 1-5 GM-% IV SOLN
1.0000 g | Freq: Three times a day (TID) | INTRAVENOUS | Status: DC
  Filled 2015-10-06 (×2): qty 50

## 2015-10-06 MED ORDER — HALOPERIDOL LACTATE 5 MG/ML IJ SOLN
INTRAMUSCULAR | Status: AC
  Filled 2015-10-06: qty 1

## 2015-10-06 MED ORDER — HYDROMORPHONE HCL 1 MG/ML IJ SOLN
0.2500 mg | INTRAMUSCULAR | Status: DC | PRN
  Administered 2015-10-06 (×2): 0.5 mg via INTRAVENOUS

## 2015-10-06 MED ORDER — ACETAMINOPHEN 325 MG PO TABS: 325.0000 mg | ORAL_TABLET | ORAL | Status: DC | PRN

## 2015-10-06 MED ORDER — OXYCODONE HCL 5 MG PO TABS: 5.0000 mg | ORAL_TABLET | Freq: Once | ORAL | Status: DC | PRN

## 2015-10-06 MED ORDER — METHOCARBAMOL 1000 MG/10ML IJ SOLN: 500.0000 mg | Freq: Four times a day (QID) | INTRAVENOUS | Status: DC | PRN

## 2015-10-06 MED ORDER — FAMOTIDINE 20 MG PO TABS: 20.0000 mg | ORAL_TABLET | Freq: Two times a day (BID) | ORAL | Status: DC | PRN

## 2015-10-06 MED ORDER — OXYCODONE HCL 5 MG PO TABS
5.0000 mg | ORAL_TABLET | ORAL | Status: DC | PRN
  Administered 2015-10-06 (×2): 10 mg via ORAL
  Administered 2015-10-13: 5 mg via ORAL
  Administered 2015-10-16: 10 mg via ORAL
  Filled 2015-10-06: qty 2
  Filled 2015-10-06: qty 1
  Filled 2015-10-06 (×2): qty 2

## 2015-10-06 MED ORDER — PROMETHAZINE HCL 25 MG RE SUPP: 12.5000 mg | Freq: Four times a day (QID) | RECTAL | Status: DC | PRN

## 2015-10-06 MED ORDER — SODIUM CHLORIDE 0.9 % IR SOLN
Status: DC | PRN
  Administered 2015-10-05: 1000 mL

## 2015-10-06 MED ORDER — ONDANSETRON HCL 4 MG/2ML IJ SOLN: 4.0000 mg | Freq: Four times a day (QID) | INTRAMUSCULAR | Status: DC | PRN

## 2015-10-06 MED ORDER — CEPHALEXIN 500 MG PO CAPS
500.0000 mg | ORAL_CAPSULE | Freq: Four times a day (QID) | ORAL | Status: DC
  Administered 2015-10-06 – 2015-10-16 (×39): 500 mg via ORAL
  Filled 2015-10-06 (×38): qty 1

## 2015-10-06 MED ORDER — ONDANSETRON HCL 4 MG PO TABS
4.0000 mg | ORAL_TABLET | Freq: Four times a day (QID) | ORAL | Status: DC | PRN
Start: 2015-10-06 — End: 2015-10-16

## 2015-10-06 MED ORDER — METHOCARBAMOL 500 MG PO TABS
500.0000 mg | ORAL_TABLET | Freq: Four times a day (QID) | ORAL | Status: DC | PRN
  Administered 2015-10-06 – 2015-10-13 (×2): 500 mg via ORAL
  Filled 2015-10-06 (×2): qty 1

## 2015-10-06 MED ORDER — ACETAMINOPHEN 160 MG/5ML PO SOLN
325.0000 mg | ORAL | Status: DC | PRN
  Filled 2015-10-06: qty 20.3

## 2015-10-06 MED ORDER — CEFAZOLIN SODIUM 1-5 GM-% IV SOLN
1.0000 g | INTRAVENOUS | Status: AC
  Administered 2015-10-06: 1 g via INTRAVENOUS
  Filled 2015-10-06: qty 50

## 2015-10-06 MED ORDER — BENZTROPINE MESYLATE 1 MG/ML IJ SOLN
0.5000 mg | Freq: Two times a day (BID) | INTRAMUSCULAR | Status: DC
  Administered 2015-10-06 – 2015-10-07 (×2): 0.5 mg via INTRAMUSCULAR
  Filled 2015-10-06 (×5): qty 0.5

## 2015-10-06 MED ORDER — VITAMIN C 500 MG PO TABS
1000.0000 mg | ORAL_TABLET | Freq: Every day | ORAL | Status: DC
  Administered 2015-10-06 – 2015-10-09 (×4): 1000 mg via ORAL
  Administered 2015-10-10: 500 mg via ORAL
  Administered 2015-10-11 – 2015-10-16 (×6): 1000 mg via ORAL
  Filled 2015-10-06 (×10): qty 2

## 2015-10-06 MED ORDER — LORAZEPAM 2 MG/ML IJ SOLN
1.0000 mg | Freq: Two times a day (BID) | INTRAMUSCULAR | Status: DC
  Administered 2015-10-06 – 2015-10-07 (×2): 1 mg via INTRAMUSCULAR
  Filled 2015-10-06 (×2): qty 1

## 2015-10-06 MED ORDER — SODIUM CHLORIDE 0.9 % IR SOLN
Status: DC | PRN
  Administered 2015-10-06: 3000 mL

## 2015-10-06 MED ORDER — HYDROMORPHONE HCL 1 MG/ML IJ SOLN
INTRAMUSCULAR | Status: AC
  Administered 2015-10-06: 01:00:00
  Filled 2015-10-06: qty 1

## 2015-10-06 MED ORDER — OXYCODONE HCL 5 MG/5ML PO SOLN: 5.0000 mg | Freq: Once | ORAL | Status: DC | PRN

## 2015-10-06 MED ORDER — HALOPERIDOL LACTATE 5 MG/ML IJ SOLN
5.0000 mg | Freq: Two times a day (BID) | INTRAMUSCULAR | Status: DC
Start: 2015-10-06 — End: 2015-10-07
  Administered 2015-10-06 – 2015-10-07 (×2): 5 mg via INTRAMUSCULAR
  Filled 2015-10-06: qty 1

## 2015-10-06 NOTE — Consult Note (Signed)
[place] Psychiatry Consult   Reason for Consult:  Suicide attempt, psychosis Referring Physician:  Dr. Amedeo Plenty Patient Identification: Stephen [name] MRN:  [id number] Principal Diagnosis: <principal problem not specified> Diagnosis:   Patient Active Problem List   Diagnosis Date Noted  . Laceration of wrist with complication [Q76.226J] 33/54/5625    Total Time spent with patient: 45 minutes  Subjective:   Stephen [name] is a 29 y.o. male patient admitted with  lacceration to right wrist  HPI:  Patient is a 29 year old Hispanic male from [place]. Interview was conducted via an interpreter. He states that he has been in the [place] for 8 or 9 years. He's currently not working. He states that his entire family lives in [place] and he only has 1 or 2 friends here. He states that he's had mental illness for several months. He wanted to kill himself by cutting himself with a saw and he injured his right wrist. A few months ago he tried to kill himself by jumping out of a tree and ended up with knee surgery to his right knee. He was hospitalized in the hospital helped him pay for a couple of months at a shelter which she claims is in [place].  The patient states that he hears voices telling him that he is either a devil or an angel. The voices are telling him to "either kill myself or to get married." He is obviously responding to internal stimuli. He is refusing his antibiotic medication. He was repeatedly told this was to help him prevent infection but he claims he doesn't need it and this is not the real problem. He states that the real problem is his mental illness. He has been depressed sad ruminating about how to kill himself. He also states he wants [place] to deport him and get him back into [place]. He states he has been on some form of medication for mental illness but he doesn't know the name of the medicines and he states they're not helping. He denies ever being in a psychiatric  hospital before. The patient became very agitated right before I got to the room tore out his IV and was trying to leave and security had to be called. He is under IVC  Past Psychiatric History: He has had several suicide attempts in the past  Risk to Self: Is patient at risk for suicide?: Yes Risk to Others:   Prior Inpatient Therapy:   Prior Outpatient Therapy:    Past Medical History:  Past Medical History  Diagnosis Date  . Hearing voices     Pt does not know exactly what it is but he does hear voices    Past Surgical History  Procedure Laterality Date  . Fracture surgery Right 2016    right lower leg - August   . Back surgery  August    Family History: History reviewed. No pertinent family history. Family Psychiatric  History: none Social History:  History  Alcohol Use No     History  Drug Use No    Social History   Social History  . Marital Status: Single    Spouse Name: N/A  . Number of Children: N/A  . Years of Education: N/A   Social History Main Topics  . Smoking status: Current Some Day Smoker  . Smokeless tobacco: Never Used  . Alcohol Use: No  . Drug Use: No  . Sexual Activity: Not Asked   Other Topics Concern  . None   Social History  Narrative  . None   Additional Social History:                          Allergies:  No Known Allergies  Labs:  Results for orders placed or performed during the hospital encounter of 10/05/15 (from the past 48 hour(s))  Comprehensive metabolic panel     Status: None   Collection Time: 10/05/15  3:00 PM  Result Value Ref Range   Sodium 137 mmol/L   Potassium 3.9 mmol/L   Chloride 105 mmol/L   CO2 22 mmol/L   Glucose, Bld 143 mg/dL   BUN 9 mg/dL   Creatinine, Ser 0.82 mg/dL   Calcium 9.5 mg/dL   Total Protein 7.4 g/dL   Albumin 4.6 g/dL   AST 23 U/L   ALT 14 U/L   Alkaline Phosphatase 139 U/L   Total Bilirubin 0.6 mg/dL   GFR calc non Af Amer NOT CALCULATED mL/min   GFR calc Af Amer NOT  CALCULATED mL/min    Comment: (NOTE) The eGFR has been calculated using the CKD EPI equation. This calculation has not been validated in all clinical situations. eGFR's persistently <60 mL/min signify possible Chronic Kidney Disease.    Anion gap 10   Ethanol (ETOH)     Status: None   Collection Time: 10/05/15  3:00 PM  Result Value Ref Range   Alcohol, Ethyl (B) <5 mg/dL    Comment:        LOWEST DETECTABLE LIMIT FOR SERUM ALCOHOL IS 5 mg/dL FOR MEDICAL PURPOSES ONLY   Salicylate level     Status: None   Collection Time: 10/05/15  3:00 PM  Result Value Ref Range   Salicylate Lvl <6.2 mg/dL  Acetaminophen level     Status: None   Collection Time: 10/05/15  3:00 PM  Result Value Ref Range   Acetaminophen (Tylenol), Serum <10 ug/mL    Comment:        THERAPEUTIC CONCENTRATIONS VARY SIGNIFICANTLY. A RANGE OF 10-30 ug/mL MAY BE AN EFFECTIVE CONCENTRATION FOR MANY PATIENTS. HOWEVER, SOME ARE BEST TREATED AT CONCENTRATIONS OUTSIDE THIS RANGE. ACETAMINOPHEN CONCENTRATIONS >150 ug/mL AT 4 HOURS AFTER INGESTION AND >50 ug/mL AT 12 HOURS AFTER INGESTION ARE OFTEN ASSOCIATED WITH TOXIC REACTIONS.   CBC     Status: None   Collection Time: 10/05/15  3:00 PM  Result Value Ref Range   WBC 9.8 K/uL   RBC 5.42 MIL/uL   Hemoglobin 15.1 g/dL   HCT 45.5 %   MCV 84.0 fL   MCH 27.8 pg   MCHC 33.1 g/dL   RDW 15.1 %   Platelets 242 K/uL    Current Facility-Administered Medications  Medication Dose Route Frequency Provider Last Rate Last Dose  . cephALEXin (KEFLEX) capsule 500 mg  500 mg Oral 4 times per day Roseanne Kaufman, MD   500 mg at 10/06/15 1234  . docusate sodium (COLACE) capsule 100 mg  100 mg Oral BID Roseanne Kaufman, MD   100 mg at 10/06/15 1029  . famotidine (PEPCID) tablet 20 mg  20 mg Oral BID PRN Roseanne Kaufman, MD      . lactated ringers infusion   Intravenous Continuous Roseanne Kaufman, MD 75 mL/hr at 10/06/15 0600    . methocarbamol (ROBAXIN) tablet 500 mg  500 mg  Oral Q6H PRN Roseanne Kaufman, MD   500 mg at 10/06/15 0146   Or  . methocarbamol (ROBAXIN) 500 mg in dextrose 5 % 50 mL IVPB  500 mg Intravenous Q6H PRN Roseanne Kaufman, MD      . morphine 2 MG/ML injection 1 mg  1 mg Intravenous Q2H PRN Roseanne Kaufman, MD      . ondansetron [facility]) tablet 4 mg  4 mg Oral Q6H PRN Roseanne Kaufman, MD       Or  . ondansetron [facility]) injection 4 mg  4 mg Intravenous Q6H PRN Roseanne Kaufman, MD      . oxyCODONE (Oxy IR/ROXICODONE) immediate release tablet 5-10 mg  5-10 mg Oral Q3H PRN Roseanne Kaufman, MD   10 mg at 10/06/15 1029  . promethazine (PHENERGAN) suppository 12.5 mg  12.5 mg Rectal Q6H PRN Roseanne Kaufman, MD      . vitamin C (ASCORBIC ACID) tablet 1,000 mg  1,000 mg Oral Daily Roseanne Kaufman, MD   1,000 mg at 10/06/15 1029    Musculoskeletal: Strength & Muscle Tone: within normal limits Gait & Station: normal Patient leans: N/A  Psychiatric Specialty Exam: Review of Systems  Musculoskeletal: Positive for joint pain.  Psychiatric/Behavioral: Positive for depression, suicidal ideas and hallucinations. The patient is nervous/anxious.     Blood pressure 118/74, pulse 97, temperature 98.6 F (37 C), temperature source Oral, resp. rate 16, height 5' 6"  (1.676 m), weight 60.374 kg (133 lb 1.6 oz), SpO2 97 %.Body mass index is 21.49 kg/(m^2).  General Appearance: Disheveled and Guarded  Eye Sport and exercise psychologist::  Fair  Speech:  Garbled  Volume:  Decreased  Mood:  Anxious, Depressed, Hopeless and Irritable  Affect:  Constricted and Depressed  Thought Process:  Disorganized  Orientation:  Full (Time, Place, and Person)  Thought Content:  Delusions, Hallucinations: Auditory Command:  Telling him to harm himself, Obsessions and Rumination  Suicidal Thoughts:  Yes.  with intent/plan  Homicidal Thoughts:  No  Memory:  Immediate;   Fair Recent;   Fair Remote;   Fair  Judgement:  Impaired  Insight:  Lacking  Psychomotor Activity:  Restlessness  Concentration:  Fair   Recall:  Poor  Fund of Knowledge:Fair  Language: Good  Akathisia:  No  Handed:  Right  AIMS (if indicated):     Assets:  Communication Skills Resilience  ADL's:  Intact  Cognition: WNL  Sleep:      Treatment Plan Summary: Daily contact with patient to assess and evaluate symptoms and progress in treatment and Medication management  Disposition: Recommend psychiatric Inpatient admission when medically cleared. Patient is depressed and actively psychotic. He is refusing medication as dangerous to self as he is tried to leave the hospital and refused medical treatment to prevent infection. He will be started on IM Haldol/Cogentin/Ativan. We will continue to follow ROSS, [facility] 10/06/2015 1:24 PM

## 2015-10-06 NOTE — Progress Notes (Signed)
Patient ID: Stephen [name], male   DOB: 01/31/1986, 29 y.o.   MRN: [id number] Discussed patient's care with Dr. Tenny Crawoss. Patient certainly appears to be psychotic.  I discussed with Dr. Tenny Crawoss that I am certainly willing to do whatever I can in regards to his upper extremity issues but do feel that the psychiatric evaluation and Behavioral Health administration is outside of my scope of practice.  I have written for a case management consult for psychiatric placement at the direction of Dr. Tenny Crawoss.  His hand and arm is stable after major reconstruction.  He has no IV. He has no complications from my standpoint at this juncture.  We will try to give him by mouth Keflex.  I will discussed with the nursing staff the recommendations of Dr. Tenny Crawoss.  Dr. Tenny Crawoss be available for his care needs psychiatrically. Dr. Charlott Rakesoss's phone number is [phone]  Vladimir CroftsGramig M.D.

## 2015-10-06 NOTE — Progress Notes (Signed)
Patient ID: Stephen [name], male   DOB: 10/16/1986, 29 y.o.   MRN: [id number] Patient is alert.  Patient has been examined at length.  He is noted to have a stable physical exam. We are still waiting psychological evaluation. I've called by a [facility] in this regard once again today.  At present time his hand is stable he has intact refill. He has removed his IV. I will go ahead and convert him to by mouth antibiotics.  He is ready for discharge from our standpoint. I think his biggest concern is the behavioral health issues which are pending.  We will plan for discharge once psych eval's. I'll see him back in 12-14 days. I discharge him on antibiotics in the form of Keflex  He is done well on antibiotics in the hospital.  Vladimir CroftsGramig M.D.

## 2015-10-06 NOTE — Anesthesia Postprocedure Evaluation (Signed)
Anesthesia Post Note  Patient: Stephen [name]  Procedure(s) Performed: Procedure(s) (LRB): IRRIGATION AND DEBRIDEMENT RIGHT WRIST; REPAIR OF MULTIPLE FLEXOR TENDONS; MEDIAL NERVE NEUROLYSIS (Right)  Patient location during evaluation: PACU Anesthesia Type: General Level of consciousness: awake Pain management: pain level controlled Vital Signs Assessment: post-procedure vital signs reviewed and stable Respiratory status: spontaneous breathing Cardiovascular status: stable Postop Assessment: No signs of nausea or vomiting Anesthetic complications: no    Last Vitals:  Filed Vitals:   10/06/15 0120 10/06/15 0140  BP: 122/84 118/83  Pulse: 84 95  Temp: 36.7 C 36.8 C  Resp: 14 16    Last Pain:  Filed Vitals:   10/06/15 0141  PainSc: 1                  Wallice Granville

## 2015-10-06 NOTE — Progress Notes (Signed)
Using a Spanish interpretor, the patient expressed his desire to return to [place]. He states that he does not have anywhere to go from here. He does not have a home. He says that he does not have any desires to harm himself at this time. I will contact SW about his living situation. Awaiting pysch eval. Sitter is in the room at the bedside. Will continue to monitor.

## 2015-10-06 NOTE — Transfer of Care (Signed)
Immediate Anesthesia Transfer of Care Note  Patient: Stephen [name]  Procedure(s) Performed: Procedure(s): IRRIGATION AND DEBRIDEMENT RIGHT WRIST; REPAIR OF MULTIPLE FLEXOR TENDONS; MEDIAL NERVE NEUROLYSIS (Right)  Patient Location: PACU  Anesthesia Type:General  Level of Consciousness: oriented, sedated, patient cooperative and responds to stimulation  Airway & Oxygen Therapy: Patient Spontanous Breathing and Patient connected to nasal cannula oxygen  Post-op Assessment: Report given to RN, Post -op Vital signs reviewed and stable and Patient moving all extremities X 4  Post vital signs: Reviewed and stable  Last Vitals:  Filed Vitals:   10/05/15 2030 10/06/15 0050  BP: 120/92 128/89  Pulse: 104 90  Temp: 37 C 36.6 C  Resp: 17 16    Complications: No apparent anesthesia complications

## 2015-10-06 NOTE — Op Note (Signed)
See ZOXW#[id number] GramigMD

## 2015-10-06 NOTE — Op Note (Deleted)
NAMClint [name]:  [name], Stephen            ACCOUNT NO.:  [id number]  MEDICAL RECORD NO.:  [id number]  LOCATION:  ED23A                        FACILITY:  [facility]  PHYSICIAN:  Dionne AnoWilliam M. Corah [name], M.D.DATE OF BIRTH:  03-12-1986  DATE OF PROCEDURE: DATE OF DISCHARGE:  10/05/2015                              OPERATIVE REPORT   PREOPERATIVE DIAGNOSES:  Large laceration, right wrist with tendon nerve and artery injury.  POSTOPERATIVE DIAGNOSIS:  Large laceration, right wrist with tendon nerve and artery injury.  PROCEDURE: 1. Irrigation and debridement, excisional nature, skin and     subcutaneous tissue, periosteal tissue, tendon, and muscle.  This     was an excisional debridement with curette, knife, blade, and     scissor. 2. Median nerve release and neurolysis, extensive in nature, with     exploration. 3. Open carpal tunnel release, right wrist. 4. Flexor pollicis longus repair at the wrist level, right upper     extremity. 5. Flexor carpi radialis repair, right wrist. 6. Superficial radial nerve repair, with primary repair and AxoGuard     nerve connector. 7. Abductor pollicis longus tendon repair. 8. Extensor pollicis brevis tendon repair at the right wrist. 9. Palmaris longus tenotomy, right wrist.  SURGEON:  Dionne AnoWilliam M. Stephen [name], M.D.  ASSISTANT:  Stephen ChimeraBrian Buchanan, PA-C.  COMPLICATIONS:  None.  ANESTHESIA:  General.  TOURNIQUET TIME:  Less than an hour.  INDICATIONS:  This patient is a Hispanic male, who is 29 years of age and sustained a self-inflicted laceration today.  I counseled him in regard to this and the findings, he has a significant disarray of the soft tissues and abnormalities and we would recommend surgical exploration and repair.  He has been seen him by emergency room department and he has verbalized through an interpreter to myself and nursing staff, he does not wish to harm himself.  Nevertheless will obtain a psychiatric evaluation given the history.  At  present time, he is ready to proceed with surgical intervention.  OPERATION IN DETAIL:  The patient was seen by myself and Anesthesia, taken to the operative theater and underwent a smooth induction of general anesthesia.  He was laid supine and fully padded, prepped and draped in usual sterile fashion with Betadine scrub and paint. Following this, the patient underwent a very careful and cautious approach to the extremity with exploration and initial irrigation and debridement, we explored all areas.  At this time, I performed an open carpal tunnel release and also a release of the antebrachial fascia. This was performed to evaluate the median nerve.  The median nerve was intact, hyperemic, and there was no gross injury here.  The neurolysis was accomplished, proximal and distal to the injury site.  Following this, carpal tunnel was released off the ulnar ledge to prevent postop problems including edema, swelling, and the hemorrhage into the canal.  Following carpal tunnel release, we identified the tendon lacerations to the FPL, FCR, palmaris longus, radial artery tributary, superficial radial nerve, and the EPB and APL tendons.  At this time, I irrigated copiously with greater than 3 L of saline.  This was an excisional debridement of skin, subcutaneous tissue, muscle, and tendon with associated soft tissue structures.  This was performed with curette, knife, blade, and scissor.  Following this, drapes were changed and the patient underwent a sequential repair the FPL.  It was repaired with a four-strand technique of 3-0 FiberWire.  Following this, the FCR was similarly repaired with a four-strand technique of 3-0 FiberWire.  Following this, the APL (abductors pollicis longus) was repaired with a 4-strand repair of 3-0 FiberWire.  Following this, the EPB was repaired with similar FiberWire suturing technique.  I did release the proximal and distal to the first  dorsal compartment, but made sure the tendons did not bowstring.  The palmaris longus underwent a tenotomy as this was not a critical structure to repair.  The radial artery was tied off accordingly, as the patient had excellent refill, no complications.  Following this, I placed a nerve connector from AxoGen 2 mm pre-soaked proximally and then coapted the nerve after freshening the ends about superficial radial nerve.  This was sutured with 8-0 nylon in an epineurial fashion.  The nerve connector was then placed over the top of the repair and all looked excellent.  The tourniquet was deflated. Hemostasis secured.  There were no complicating features.  Please add under operative report ligation radial artery.  The wound was closed over TLS straighten size 7 with excellent hemostasis noted and there were no complicating features.  He will be monitored in the recovery room.  We will plan for a sitter tonight and IV antibiotics, and no other measures, given his presentation in the very late hour.  All questions have been addressed, encouraged, and answered.  Postoperatively, we will begin gentle interval active assisted range of motion and a Duran program through the auspices of therapy.     Dionne Ano. Stephen Pea, M.D.     [facility]  D:  10/06/2015  T:  10/06/2015  Job:  [id number]

## 2015-10-06 NOTE — Op Note (Signed)
Stephen [name], Stephen [name] NO.:  [id number]  MEDICAL RECORD NO.:  [id number]  LOCATION:  5N30C                        FACILITY:  [facility]  PHYSICIAN:  Stephen [name], M.D.DATE OF BIRTH:  12/21/1985  DATE OF PROCEDURE:  10/06/2015 DATE OF DISCHARGE:  10/16/2015                              OPERATIVE REPORT   PREOPERATIVE DIAGNOSES:  Large laceration, right wrist with tendon nerve and artery injury.  POSTOPERATIVE DIAGNOSIS:  Large laceration, right wrist with tendon nerve and artery injury.  PROCEDURE: 1. Irrigation and debridement, excisional nature, skin and     subcutaneous tissue, periosteal tissue, tendon, and muscle.  This     was an excisional debridement with curette, knife, blade, and     scissor. 2. Median nerve release and neurolysis, extensive in nature, with     exploration. 3. Open carpal tunnel release, right wrist. 4. Flexor pollicis longus repair at the wrist level, right upper     extremity. 5. Flexor carpi radialis repair, right wrist. 6. Superficial radial nerve repair, with primary repair and AxoGuard     nerve connector. 7. Abductor pollicis longus tendon repair. 8. Extensor pollicis brevis tendon repair at the right wrist. 9. Palmaris longus tenotomy, right wrist.  SURGEON:  Stephen [name], M.D.  ASSISTANT:  Karie Chimera, PA-C.  COMPLICATIONS:  None.  ANESTHESIA:  General.  TOURNIQUET TIME:  Less than an hour.  INDICATIONS:  This patient is a Hispanic male, who is 29 years of age and sustained a self-inflicted laceration today.  I counseled him in regard to this and the findings, he has a significant disarray of the soft tissues and abnormalities and we would recommend surgical exploration and repair.  He has been seen him by emergency room department and he has verbalized through an interpreter to myself and nursing staff, he does not wish to harm himself.  Nevertheless will obtain a psychiatric evaluation given the  history.  At present time, he is ready to proceed with surgical intervention.  OPERATION IN DETAIL:  The patient was seen by myself and Anesthesia, taken to the operative theater and underwent a smooth induction of general anesthesia.  He was laid supine and fully padded, prepped and draped in usual sterile fashion with Betadine scrub and paint. Following this, the patient underwent a very careful and cautious approach to the extremity with exploration and initial irrigation and debridement, we explored all areas.  At this time, I performed an open carpal tunnel release and also a release of the antebrachial fascia. This was performed to evaluate the median nerve.  The median nerve was intact, hyperemic, and there was no gross injury here.  The neurolysis was accomplished, proximal and distal to the injury site.  Following this, carpal tunnel was released off the ulnar ledge to prevent postop problems including edema, swelling, and the hemorrhage into the canal.  Following carpal tunnel release, we identified the tendon lacerations to the FPL, FCR, palmaris longus, radial artery tributary, superficial radial nerve, and the EPB and APL tendons.  At this time, I irrigated copiously with greater than 3 L of saline.  This was an excisional debridement of skin, subcutaneous tissue, muscle, and tendon with associated soft tissue  structures.  This was performed with curette, knife, blade, and scissor.  Following this, drapes were changed and the patient underwent a sequential repair the FPL.  It was repaired with a four-strand technique of 3-0 FiberWire.  Following this, the FCR was similarly repaired with a four-strand technique of 3-0 FiberWire.  Following this, the APL (abductors pollicis longus) was repaired with a 4-strand repair of 3-0 FiberWire.  Following this, the EPB was repaired with similar FiberWire suturing technique.  I did release the proximal and distal to the first  dorsal compartment, but made sure the tendons did not bowstring.  The palmaris longus underwent a tenotomy as this was not a critical structure to repair.  The radial artery was tied off accordingly, as the patient had excellent refill, no complications.  Following this, I placed a nerve connector from AxoGen 2 mm pre-soaked proximally and then coapted the nerve after freshening the ends about superficial radial nerve.  This was sutured with 8-0 nylon in an epineurial fashion.  The nerve connector was then placed over the top of the repair and all looked excellent.  The tourniquet was deflated. Hemostasis secured.  There were no complicating features.  Please add under operative report ligation radial artery.  The wound was closed over TLS straighten size 7 with excellent hemostasis noted and there were no complicating features.  He will be monitored in the recovery room.  We will plan for a sitter tonight and IV antibiotics, and no other measures, given his presentation in the very late hour.  All questions have been addressed, encouraged, and answered.  Postoperatively, we will begin gentle interval active assisted range of motion and a Stephen [name] program through the auspices of therapy.     Stephen AnoWilliam M. Amanda PeaGramig, M.D.     [facility]  D:  10/06/2015  T:  10/06/2015  Job:  [id number]

## 2015-10-06 NOTE — Discharge Instructions (Signed)
.  We recommend that you to take vitamin C 1000 mg a day to promote healing. °We also recommend that if you require  pain medicine that you take a stool softener to prevent constipation as most pain medicines will have constipation side effects. We recommend either Peri-Colace or Senokot and recommend that you also consider adding MiraLAX to prevent the constipation affects from pain medicine if you are required to use them. These medicines are over the counter and maybe purchased at a local pharmacy. A cup of yogurt and a probiotic can also be helpful during the recovery process as the medicines can disrupt your intestinal environment. °.Keep bandage clean and dry.  Call for any problems.  No smoking.  Criteria for driving a car: you should be off your pain medicine for 7-8 hours, able to drive one handed(confident), thinking clearly and feeling able in your judgement to drive. °Continue elevation as it will decrease swelling.  If instructed by MD move your fingers within the confines of the bandage/splint.  Use ice if instructed by your MD. Call immediately for any sudden loss of feeling in your hand/arm or change in functional abilities of the extremity. °

## 2015-10-07 DIAGNOSIS — F329 Major depressive disorder, single episode, unspecified: Secondary | ICD-10-CM

## 2015-10-07 DIAGNOSIS — R45851 Suicidal ideations: Secondary | ICD-10-CM

## 2015-10-07 MED ORDER — BENZTROPINE MESYLATE 0.5 MG PO TABS
0.5000 mg | ORAL_TABLET | Freq: Two times a day (BID) | ORAL | Status: DC
Start: 2015-10-07 — End: 2015-10-16
  Administered 2015-10-07 – 2015-10-16 (×18): 0.5 mg via ORAL
  Filled 2015-10-07 (×24): qty 1

## 2015-10-07 MED ORDER — HALOPERIDOL 5 MG PO TABS
5.0000 mg | ORAL_TABLET | Freq: Two times a day (BID) | ORAL | Status: DC
Start: 2015-10-07 — End: 2015-10-16
  Administered 2015-10-07 – 2015-10-16 (×18): 5 mg via ORAL
  Filled 2015-10-07 (×26): qty 1

## 2015-10-07 MED ORDER — LORAZEPAM 1 MG PO TABS
1.0000 mg | ORAL_TABLET | Freq: Two times a day (BID) | ORAL | Status: DC
  Administered 2015-10-07 – 2015-10-16 (×18): 1 mg via ORAL
  Filled 2015-10-07 (×18): qty 1

## 2015-10-07 NOTE — Consult Note (Signed)
[facility]   Reason for Consult:  Suicide attempt, psychosis Referring Physician:  Dr. Amanda PeaGramig Patient Identification: Stephen [name] MRN:  [id number] Principal Diagnosis: <principal problem not specified> Diagnosis:   Patient Active Problem List   Diagnosis Date Noted  . Laceration of wrist with complication [S61.519A] 10/06/2015    Total Time spent with patient: 45 minutes  Subjective:   Stephen [name] is a 29 y.o. male patient admitted with  lacceration to right wrist  HPI:  Patient is a 29 year old Hispanic male from [place]. Interview was conducted via an interpreter. He states that he has been in the [place] 8 or 9 years. He's currently not working. He states that his entire family lives in [place] and he only has 1 or 2 friends here. He states that he's had mental illness for several months. He wanted to kill himself by cutting himself with a saw and he injured his right wrist. A few months ago he tried to kill himself by jumping out of a tree and ended up with knee surgery to his right knee. He was hospitalized in the hospital helped him pay for a couple of months at a shelter which she claims is in [place].  The patient states that he hears voices telling him that he is either a devil or an angel. The voices are telling him to "either kill myself or to get married." He is obviously responding to internal stimuli. He is refusing his antibiotic medication. He was repeatedly told this was to help him prevent infection but he claims he doesn't need it and this is not the real problem. He states that the real problem is his mental illness. He has been depressed sad ruminating about how to kill himself. He also states he wants us to deport him and get him back into [place]. He states he has been on some form of medication for mental illness but he doesn't know the name of the medicines and he states they're not helping. He denies ever being in a  psychiatric hospital before. The patient became very agitated right before I got to the room tore out his IV and was trying to leave and security had to be called. He is under IVC  Patient seen at in follow-up today 10/07/2015. He is quiet and resting comfortably. He states he is somewhat better. He denies hearing voices today but still feels very depressed. He has been taking his IM meds-Haldol/Ativan/Cogentin. Nursing staff feels that he can switch to by mouth as he has been taking his other meds by mouth. Nursing also reports that he has had thoughts of suicide today although he denied this with me.  Past Psychiatric History: He has had several suicide attempts in the past  Risk to Self: Is patient at risk for suicide?: Yes Risk to Others:   Prior Inpatient Therapy:   Prior Outpatient Therapy:    Past Medical History:  Past Medical History  Diagnosis Date  . Hearing voices     Pt does not know exactly what it is but he does hear voices    Past Surgical History  Procedure Laterality Date  . Fracture surgery Right 2016    right lower leg - August   . Back surgery  August    Family History: History reviewed. No pertinent family history. Family Psychiatric  History: none Social History:  History  Alcohol Use No     History  Drug Use No    Social History  Social History  . Marital Status: Single    Spouse Name: N/A  . Number of Children: N/A  . Years of Education: N/A   Social History Main Topics  . Smoking status: Current Some Day Smoker  . Smokeless tobacco: Never Used  . Alcohol Use: No  . Drug Use: No  . Sexual Activity: Not Asked   Other Topics Concern  . None   Social History Narrative  . None   Additional Social History:                          Allergies:  No Known Allergies  Labs:  No results found for this or any previous visit (from the past 48 hour(s)).  Current Facility-Administered Medications  Medication Dose Route Frequency  Provider Last Rate Last Dose  . benztropine mesylate (COGENTIN) injection 0.5 mg  0.5 mg Intramuscular BID Myrlene Broker, MD   0.5 mg at 10/07/15 1015  . cephALEXin (KEFLEX) capsule 500 mg  500 mg Oral 4 times per day Dominica Severin, MD   500 mg at 10/07/15 1308  . docusate sodium (COLACE) capsule 100 mg  100 mg Oral BID Dominica Severin, MD   100 mg at 10/06/15 1029  . famotidine (PEPCID) tablet 20 mg  20 mg Oral BID PRN Dominica Severin, MD      . haloperidol lactate (HALDOL) injection 5 mg  5 mg Intramuscular BID Myrlene Broker, MD   5 mg at 10/07/15 1015  . lactated ringers infusion   Intravenous Continuous Dominica Severin, MD 75 mL/hr at 10/06/15 0600    . LORazepam (ATIVAN) injection 1 mg  1 mg Intramuscular BID Myrlene Broker, MD   1 mg at 10/07/15 1015  . methocarbamol (ROBAXIN) tablet 500 mg  500 mg Oral Q6H PRN Dominica Severin, MD   500 mg at 10/06/15 0146   Or  . methocarbamol (ROBAXIN) 500 mg in dextrose 5 % 50 mL IVPB  500 mg Intravenous Q6H PRN Dominica Severin, MD      . morphine 2 MG/ML injection 1 mg  1 mg Intravenous Q2H PRN Dominica Severin, MD      . ondansetron [facility]) tablet 4 mg  4 mg Oral Q6H PRN Dominica Severin, MD       Or  . ondansetron [facility]) injection 4 mg  4 mg Intravenous Q6H PRN Dominica Severin, MD      . oxyCODONE (Oxy IR/ROXICODONE) immediate release tablet 5-10 mg  5-10 mg Oral Q3H PRN Dominica Severin, MD   10 mg at 10/06/15 1029  . promethazine (PHENERGAN) suppository 12.5 mg  12.5 mg Rectal Q6H PRN Dominica Severin, MD      . vitamin C (ASCORBIC ACID) tablet 1,000 mg  1,000 mg Oral Daily Dominica Severin, MD   1,000 mg at 10/06/15 1029    Musculoskeletal: Strength & Muscle Tone: within normal limits Gait & Station: normal Patient leans: N/A  Psychiatric Specialty Exam: Review of Systems  Musculoskeletal: Positive for joint pain.  Psychiatric/Behavioral: Positive for depression, suicidal ideas and hallucinations. The patient is nervous/anxious.     Blood pressure  109/63, pulse 87, temperature 99.1 F (37.3 C), temperature source Oral, resp. rate 16, height  (1.676 m), weight 60.374 kg (133 lb 1.6 oz), SpO2 98 %.Body mass index is 21.49 kg/(m^2).  General Appearance: Disheveled and Guarded  Eye Contact::  Fair  Speech:  Garbled  Volume:  Decreased  Mood:  Anxious and depressed  Affect:  Constricted and Depressed  Thought Process:  Disorganized  Orientation:  Full (Time, Place, and Person)  Thought Content:  Delusions, Hallucinations: Auditory Command:  Telling him to harm himself, Obsessions and Rumination states these have diminished today   Suicidal Thoughts:  Yes.  with intent/plan  Homicidal Thoughts:  No  Memory:  Immediate;   Fair Recent;   Fair Remote;   Fair  Judgement:  Impaired  Insight:  Lacking  Psychomotor Activity:  Calm   Concentration:  Fair  Recall:  Poor  Fund of Knowledge:Fair  Language: Good  Akathisia:  No  Handed:  Right  AIMS (if indicated):     Assets:  Communication Skills Resilience  ADL's:  Intact  Cognition: WNL  Sleep:      Treatment Plan Summary: Daily contact with patient to assess and evaluate symptoms and progress in treatment and Medication management  Disposition: Recommend psychiatric Inpatient admission when medically cleared. Patient is depressed and suicidal. Psychiatric social worker is actively looking for beds. He is now able to switch to by mouth meds. We will continue to follow Nathon Stefanski, [facility] 10/07/2015 3:22 PM

## 2015-10-07 NOTE — Progress Notes (Addendum)
Disposition CSW completed [facility] Referral form and [facility]ed [facility] for [facility].  Pending [facility] approval patient will [facility].  Patient also being [facility], currently no beds are available.  Pt [facility].  [facility] staff ask that as Ortho, PT, and OT notes are put in system to please forward to [facility] admissions.  Seward SpeckLeo Nicolette Gieske [facility] Behavioral Health Disposition CSW [phone]

## 2015-10-07 NOTE — Progress Notes (Signed)
Patient ID: Stephen [name], male   DOB: 09/07/1986, 29 y.o.   MRN: [id number] Patient is alert. His vital signs are stable. His hand is stable. He has good refill no complications  I discussed with patient through an interpreter on the telephone and the present issues and plans.  He took his bandage off completely and I thus re-bandaged him today. The the interpreter and I explained the issues of keeping the area clean and dry. It is very important to not manipulate his wound and to not cause harm to the area again. The patient appears to understand this.  Nursing staff was present.  He is tolerating a diet. He is alert. After the medicines were given to him earlier today he feels calm.  I explained to patient we're awaiting inpatient psychiatric evaluation and treatment.  Once again from my standpoint he is stable and ready for discharge. I've explained this to our psychiatry services.  Also explained to the patient our plans and his situation.  I explained to patient we're going to try to do everything for him possible. He understands this.  Malak Duchesneau M.D.

## 2015-10-08 ENCOUNTER — Encounter (HOSPITAL_COMMUNITY): Payer: Self-pay | Admitting: Orthopedic Surgery

## 2015-10-08 DIAGNOSIS — F333 Major depressive disorder, recurrent, severe with psychotic symptoms: Secondary | ICD-10-CM

## 2015-10-08 NOTE — Progress Notes (Signed)
Subjective: 3 Days Post-Op Procedure(s) (LRB): IRRIGATION AND DEBRIDEMENT RIGHT WRIST; REPAIR OF MULTIPLE FLEXOR TENDONS; MEDIAL NERVE NEUROLYSIS (Right) Interpreter services was utilized during postoperative visit this morning. The patient states that he is feeling better, he denies significant pain about the hand. He feels as though his sensation is fairly normal about the tips of the finger. Currently he denies hearing voices and denies current suicidal ideation or thoughts of harming someone else since administration of his antipsychotic medications. We have discussed all issues with him at length.   Objective: Vital signs in last 24 hours: Temp:  [98.4 F (36.9 C)-99.1 F (37.3 C)] 98.6 F (37 C) (11/28 62130633) Pulse Rate:  [68-87] 68 (11/28 0633) Resp:  [16-18] 18 (11/28 [id number]) BP: (92-109)/(58-65) 92/58 mmHg (11/28 0633) SpO2:  [98 %-100 %] 98 % (11/28 [id number])  Intake/Output from previous day: 11/27 0701 - 11/28 0700 In: 240 [P.O.:240] Out: -  Intake/Output this shift:     Recent Labs  10/05/15 1500  HGB 15.1    Recent Labs  10/05/15 1500  WBC 9.8  RBC 5.42  HCT 45.5  PLT 242    Recent Labs  10/05/15 1500  NA 137  K 3.9  CL 105  CO2 22  BUN 9  CREATININE 0.82  GLUCOSE 143  CALCIUM 9.5   No results for input(s): LABPT, INR in the last 72 hours.  The patient is pleasant, in no acute distress to this juncture. He does not appear to be responding to any internal stimuli, he is appropriate and answers questions appropriately. Evaluation of the upper extremity shows that he has excellent refill about the digits he states his sensation feels normal he is able to flex and extend the digits without difficulty there no signs of infection or dystrophy present.  Assessment/Plan: 3 Days Post-Op Procedure(s) (LRB): IRRIGATION AND DEBRIDEMENT RIGHT WRIST; REPAIR OF MULTIPLE FLEXOR TENDONS; MEDIAL NERVE NEUROLYSIS (Right) I have discussed with the patient currently he is  stable in regards to his upper extremity is stable from an orthopedic standpoint in regards to his hand, however, we are waiting assistance from behavioral health in regards to his disposition. Apparently, he has been placed on a waiting list for inpatient behavioral health services. I have contacted behavioral health case management this morning in regards to his disposition and an expected timeframe that a bed may be available. Currently, this is still pending. The patient is medically stable from an orthopedic standpoint and can be discharged to an inpatient psychiatric facility as sent as a bed becomes available. The patient understands this and is in  Agreement.  Philbert Ocallaghan L 10/08/2015, 8:36 AM

## 2015-10-08 NOTE — Consult Note (Signed)
[facility] Face-to-Face Psychiatry Consult   Reason for Consult:  Suicide attempt, psychosis Referring Physician:  Dr. Amanda Pea Patient Identification: Stephen [name] MRN:  [id number] Principal Diagnosis: Severe recurrent major depressive disorder with psychotic features [facility]) Diagnosis:   Patient Active Problem List   Diagnosis Date Noted  . Laceration of wrist with complication [S61.519A] 10/06/2015    Total Time spent with patient: 45 minutes  Subjective:   Stephen [name] is a 29 y.o. male patient admitted with  lacceration to right wrist  HPI:  Patient is a 29 year old Hispanic male from [place]. Interview was conducted via an interpreter. He states that he has been in the [place] for 8 or 9 years. He's currently not working. He states that his entire family lives in [place] and he only has 1 or 2 friends here. He states that he's had mental illness for several months. He wanted to kill himself by cutting himself with a saw and he injured his right wrist. A few months ago he tried to kill himself by jumping out of a tree and ended up with knee surgery to his right knee. He was hospitalized in the hospital helped him pay for a couple of months at a shelter which she claims is in [place]. The patient states that he hears voices telling him that he is either a devil or an angel. The voices are telling him to "either kill myself or to get married." He is obviously responding to internal stimuli. He is refusing his antibiotic medication. He was repeatedly told this was to help him prevent infection but he claims he doesn't need it and this is not the real problem. He states that the real problem is his mental illness. He has been depressed sad ruminating about how to kill himself. He also states he wants [place] to deport him and get him back into [place]. He states he has been on some form of medication for mental illness but he doesn't know the name of the medicines and he states they're not helping.  He denies ever being in a psychiatric hospital before. The patient became very agitated right before I got to the room tore out his IV and was trying to leave and security had to be called. He is under IVC  Past Psychiatric History: He has had several suicide attempts in the past.  Interval history: Patient evaluated today with the help of a Spanish interpreter on the phone. Patient seen today for psychiatric consultation follow-up for increased symptoms of depression and status post suicidal attempt by cutting his wrist with intention to end his life. Patient reportedly unemployed, disabled, history of separation from his significant other, homeless and has been living in shelters for several months. Patient also has a history of substance abuse including cocaine, marijuana and alcohol. Patient has history of incarceration 15 days for driving under influence. Patient has no previous history of acute psychiatric hospitalization but received psychiatric services while admitted for knee surgery in [facility] secondary to jumping out of the bridge. Patient is not able to follow outpatient mental health care. Patient has been possibly responding to his current medication regimen which is a Haldol, Ativan and Cogentin. Patient continued to report depression and auditory hallucinations which are command in nature but less distressful to him. Patient is able to cooperate with the staff and current treatment provided to him by surgery team and the psychiatric consultation team. Patient denies current suicidal ideation and request to contact immigration office for possible deportation secondary  to he failed to live in [place] and does not have friends, family money, place to live. 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 Patient considered to be unsafe secondary to recent suicidal attempts and also has no psychosocial  support in local community. Patient is able to understand that they need to be hospitalized. Patient continued to meet criteria for acute psychiatric hospitalization and psychiatric social service has been working for placement at this time.     Risk to Self: Is patient at risk for suicide?: Yes Risk to Others:   Prior Inpatient Therapy:   Prior Outpatient Therapy:    Past Medical History:  Past Medical History  Diagnosis Date  . Hearing voices     Pt does not know exactly what it is but he does hear voices    Past Surgical History  Procedure Laterality Date  . Fracture surgery Right 2016    right lower leg - August   . Back surgery  August    Family History: History reviewed. No pertinent family history. Family Psychiatric  History: none Social History:  History  Alcohol Use No     History  Drug Use No    Social History   Social History  . Marital Status: Single    Spouse Name: N/A  . Number of Children: N/A  . Years of Education: N/A   Social History Main Topics  . Smoking status: Current Some Day Smoker  . Smokeless tobacco: Never Used  . Alcohol Use: No  . Drug Use: No  . Sexual Activity: Not Asked   Other Topics Concern  . None   Social History Narrative  . None   Additional Social History:                          Allergies:  No Known Allergies  Labs:  No results found for this or any previous visit (from the past 48 hour(s)).  Current Facility-Administered Medications  Medication Dose Route Frequency Provider Last Rate Last Dose  . benztropine (COGENTIN) tablet 0.5 mg  0.5 mg Oral BID Myrlene Broker, MD   0.5 mg at 10/08/15 0953  . cephALEXin (KEFLEX) capsule 500 mg  500 mg Oral 4 times per day Dominica Severin, MD   500 mg at 10/08/15 0607  . docusate sodium (COLACE) capsule 100 mg  100 mg Oral BID Dominica Severin, MD   100 mg at 10/08/15 0953  . famotidine (PEPCID) tablet 20 mg  20 mg Oral BID PRN Dominica Severin, MD      . haloperidol  (HALDOL) tablet 5 mg  5 mg Oral BID Myrlene Broker, MD   5 mg at 10/08/15 0953  . lactated ringers infusion   Intravenous Continuous Dominica Severin, MD 75 mL/hr at 10/06/15 0600    . LORazepam (ATIVAN) tablet 1 mg  1 mg Oral BID Myrlene Broker, MD   1 mg at 10/08/15 0953  . methocarbamol (ROBAXIN) tablet 500 mg  500 mg Oral Q6H PRN Dominica Severin, MD   500 mg at 10/06/15 0146   Or  . methocarbamol (ROBAXIN) 500 mg in dextrose 5 % 50 mL IVPB  500 mg Intravenous Q6H PRN Dominica Severin, MD      . morphine 2 MG/ML injection 1 mg  1 mg Intravenous Q2H PRN Dominica Severin, MD      .  ondansetron (ZOFRAN) tablet 4 mg  4 mg Oral Q6H PRN Dominica SeverinWilliam Gramig, MD       Or  . ondansetron [facility](ZOFRAN) injection 4 mg  4 mg Intravenous Q6H PRN Dominica SeverinWilliam Gramig, MD      . oxyCODONE (Oxy IR/ROXICODONE) immediate release tablet 5-10 mg  5-10 mg Oral Q3H PRN Dominica SeverinWilliam Gramig, MD   10 mg at 10/06/15 1029  . promethazine (PHENERGAN) suppository 12.5 mg  12.5 mg Rectal Q6H PRN Dominica SeverinWilliam Gramig, MD      . vitamin C (ASCORBIC ACID) tablet 1,000 mg  1,000 mg Oral Daily Dominica SeverinWilliam Gramig, MD   1,000 mg at 10/08/15 [id number]    Musculoskeletal: Strength & Muscle Tone: within normal limits Gait & Station: normal Patient leans: N/A  Psychiatric Specialty Exam: Review of Systems :   Blood pressure 92/58, pulse 68, temperature 98.6 F (37 C), temperature source Oral, resp. rate 18, height 5\' 6"  (1.676 m), weight 60.374 kg (133 lb 1.6 oz), SpO2 98 %.Body mass index is 21.49 kg/(m^2).  General Appearance: Disheveled and Guarded  Eye SolicitorContact::  Fair  Speech:  Garbled  Volume:  Decreased  Mood:  Anxious and depressed   Affect:  Constricted and Depressed  Thought Process:  Disorganized  Orientation:  Full (Time, Place, and Person)  Thought Content:  Delusions, Hallucinations: Auditory Command:  Telling him to harm himself, Obsessions and Rumination -better with current treatment regimen     Suicidal Thoughts:  Yes.  with intent/plan   Homicidal Thoughts:  No  Memory:  Immediate;   Fair Recent;   Fair Remote;   Fair  Judgement:  Impaired  Insight:  Lacking  Psychomotor Activity:  decreased    Concentration:  Fair  Recall:  Poor  Fund of Knowledge:Fair  Language: Good  Akathisia:  No  Handed:  Right  AIMS (if indicated):     Assets:  Communication Skills Resilience  ADL's:  Intact  Cognition: WNL  Sleep:      Treatment Plan Summary: Daily contact with patient to assess and evaluate symptoms and progress in treatment and Medication management  Continue involuntary commitment  Continue safety sitter as patient has been suicidal and has a repeated attempts to kill himself Patient meets criteria for acute psychiatric hospitalization  Referred to the [facility] and also obtaining authorization from the [facility] Continue Haldol 5 mg twice daily for psychosisc Continue, Ativan 1 mg twice daily for anxiety  Continue Cogentin 0.5 mg twice daily for EPS  Disposition: Recommend psychiatric Inpatient admission when medically cleared. Appreciate psychiatric consultation and follow up as clinically required Please contact [phone] or [phone] if needs further assistance  Barnaby Rippeon,JANARDHAHA R. 10/08/2015 10:07 AM

## 2015-10-09 NOTE — Clinical Social Work Psych Assess (Signed)
Clinical Social Work Librarian, academic  Clinical Social Worker:  Melrose Nakayama Date/Time:  10/09/2015, 12:07 PM Referred By:  Clinical Social Work Date Referred:  10/09/15 Reason for Referral:  Behavioral Health Issues (Suicide attempt via wrist laceration)   Presenting Symptoms/Problems Patient presented to [facility] ED status post wrist laceration- confirmed suicide attempt.  Abuse/Neglect/Trauma History Denies History  Psychiatric History Denies History Psychiatric Medication:  Per Care Manager at [facility], patient received haldol injections at [facility] (hx), but was on Respiradol prior to admission- prescribed by [facility] during last admission.   Current Mental Health Hospitalizations/Previous Mental Health History:  Patient attempted suicide in [place]- cut throat.  Patient presented to [facility] in September after jumping from a tree.  Care Manager reports once patient jumped and realized he lived, he attempted to cut his throat with a tree limb.  Patient was admitted to [facility] after being medically cleared.  Current Provider:  No current mental health provider  Current Medications:  Patient received two months supply of medications after leaving [facility].  [employer] states they assist with patient's medications at time of discharge due to patient not having a payer source, no income and being undocumented (barrier to receiving meds)  Emotional Health/Current Symptoms Has a Plan for Suicide, Self-Injurious Behaviors (ex. picking & pinching or carving on skin, chronic runaway, poor judgement), Suicidal Ideation (ex. "I can't take anymore, I wish I could disappear"), Suicide Attempt in the Past (date/description) (two previous attempts known ) Suicide Attempt in Past (date/description):  Cut throat while in [place], Sept 2016 jumped from a tree/attempted to cut throat, admission- wrist  laceration.  Psychotic/Dissociative Symptoms Auditory Hallucinations (with command)  Attention/Behavioral Symptoms Impulsive  Cognitive Impairment Within Normal Limits, Orientation - Situation, Orientation - Time, Other - See Comment, Poor/Impaired Decision-Making, Poor Judgement, Orientation - Place, Orientation - Self  Mood and Adjustment Depression   Stress, Anxiety, Trauma, Any Recent Loss/Stressor Anxiety  Other Stress, Anxiety, Trauma, Any Recent Loss/Stressor:  Unemployed, break up of between significant other, homeless, no supports here in the [place], cannot work now due to ambulating with walker  Substance Abuse/Use Current Substance Use SBIRT Completed (please refer for detailed history): No Urinary Drug Screen Completed: No Alcohol Level:  WNL   Environment/Housing/Living Arrangement Homeless (patient was from [facility] Home[facility]) Care Manager states patient is from [facility] where he received a stipend from [place] upon discharge from there.  The stipend expired 11/18, however the group home owner extended his time until 12/3.  Patient is now homeless.  Emergency Contact:  Patient reports no support system   Financial  (No payer source- Undocumented)  Clinical Social Worker's Interpretive Summary CSW discussed patient's hx and disposition with Futures trader, Best boy at [employer], [place].  Toniann Fail states she has been following the patient since his admission to [facility] in September of this year.   Toniann Fail states the patient has attempted suicide two other times in the past (documented above).  Patient has no known supports here in the [place] other than friends who live in [place].  Patient's mother is in [place] and of note, patient is undocumented which is a major barrier with resources.  Patient is spanish speaking only and communicates with interpreter.  Upon admission, patient reports hearing voices with command of self harm.      Disposition Inpatient Referral Made [facility], [facility], Geri[facility]Psych) [facility])  [facility] and is Involuntarily Commited (IVC'd)

## 2015-10-09 NOTE — Progress Notes (Signed)
Subjective: 4 Days Post-Op Procedure(s) (LRB): IRRIGATION AND DEBRIDEMENT RIGHT WRIST; REPAIR OF MULTIPLE FLEXOR TENDONS; MEDIAL NERVE NEUROLYSIS (Right) Patient reports pain as controlled. He has no complaints this morning and overall states he's doing well.  Objective: Vital signs in last 24 hours: Temp:  [97.8 F (36.6 C)-99.2 F (37.3 C)] 97.8 F (36.6 C) (11/29 0602) Pulse Rate:  [57-94] 57 (11/29 0602) Resp:  [18] 18 (11/29 0602) BP: (97-108)/(54-62) 100/54 mmHg (11/29 0602) SpO2:  [98 %-100 %] 100 % (11/29 0602)  Intake/Output from previous day: 11/28 0701 - 11/29 0700 In: 60 [P.O.:60] Out: -  Intake/Output this shift:    No results for input(s): HGB in the last 72 hours. No results for input(s): WBC, RBC, HCT, PLT in the last 72 hours. No results for input(s): NA, K, CL, CO2, BUN, CREATININE, GLUCOSE, CALCIUM in the last 72 hours. No results for input(s): LABPT, INR in the last 72 hours.  The patient is pleasant in no acute distress currently. He was sleeping upon entering the room. Sitter is at bedside. The patient is alert and oriented in no acute distress. The patient complains of pain in the affected upper extremity.  The patient is noted to have a normal HEENT exam. Lung fields show equal chest expansion and no shortness of breath. Abdomen exam is nontender without distention. Lower extremity examination does not show any fracture dislocation or blood clot symptoms. Pelvis is stable and the neck and back are stable and nontender. Evaluation of the upper extremity shows his dressings are intact. Digits are pink and warm without signs of infection. Motion is intact.  Assessment/Plan: 4 Days Post-Op Procedure(s) (LRB): IRRIGATION AND DEBRIDEMENT RIGHT WRIST; REPAIR OF MULTIPLE FLEXOR TENDONS; MEDIAL NERVE NEUROLYSIS (Right) Overall the patient is doing well in regards to his upper extremity, I have discussed with the psychiatric case care manager his disposition.  Currently, they are waiting on bed availability potentially at a state Institute. The patient certainly has a difficult social situation as well as medical situation and there is no easy answer. The patient may be admitted for inordinate amount of time until a bed becomes available as discussed with the Child psychotherapistsocial worker.   Wesam Gearhart L 10/09/2015, 8:41 AM

## 2015-10-09 NOTE — Clinical Social Work Psych Note (Signed)
[facility].    Vickii PennaGina Uziel Covault, LCSW [phone]  [facility] Licensed Clinical Social Worker

## 2015-10-10 NOTE — Care Management Note (Signed)
Case Management Note  Patient Details  Name: Richardson Landryrudencio Prioleau MRN: [id number] Date of Birth: 11/01/1986  Subjective/Objective:           S/p I and D right wrist with repair of multiple tendons and medial nerve neurolysis         Action/Plan: CSW working on psychiatric facility placement. Will continue to follow.   Expected Discharge Date:                  Expected Discharge Plan:  Psychiatric Hospital  In-House Referral:  Clinical Social Work  Discharge planning Services  NA  Post Acute Care Choice:    Choice offered to:     DME Arranged:    DME Agency:     HH Arranged:    HH Agency:     Status of Service:  In process, will continue to follow  Medicare Important Message Given:    Date Medicare IM Given:    Medicare IM give by:    Date Additional Medicare IM Given:    Additional Medicare Important Message give by:     If discussed at Long Length of Stay Meetings, dates discussed:    Additional Comments:  Monica BectonKrieg, Dany Harten Watson, RN 10/10/2015, 11:33 AM

## 2015-10-10 NOTE — Consult Note (Signed)
[facility] Face-to-Face Psychiatry Consult   Reason for Consult:  Suicide attempt, psychosis Referring Physician:  Dr. Amanda Pea Patient Identification: Stephen [name] MRN:  [id number] Principal Diagnosis: Severe recurrent major depressive disorder with psychotic features [facility]) Diagnosis:   Patient Active Problem List   Diagnosis Date Noted  . Severe recurrent major depressive disorder with psychotic features (HCC) [F33.3] 10/08/2015  . Laceration of wrist with complication [S61.519A] 10/06/2015    Total Time spent with patient: 30 minutes  Subjective:   Stephen [name] is a 29 y.o. male patient admitted with  lacceration to right wrist  HPI:  Patient is a 29 y.o. Hispanic male from [place]. Interview was conducted via an interpreter. He states that he has been in the [place] for 8 or 9 years. He's currently not working. He states that his entire family lives in [place] and he only has 1 or 2 friends here. He states that he's had mental illness for several months. He wanted to kill himself by cutting himself with a saw and he injured his right wrist. A few months ago he tried to kill himself by jumping out of a tree and ended up with knee surgery to his right knee. He was hospitalized in the hospital helped him pay for a couple of months at a shelter which she claims is in [place]. The patient states that he hears voices telling him that he is either a devil or an angel. The voices are telling him to "either kill myself or to get married." He is obviously responding to internal stimuli. He is refusing his antibiotic medication. He was repeatedly told this was to help him prevent infection but he claims he doesn't need it and this is not the real problem. He states that the real problem is his mental illness. He has been depressed sad ruminating about how to kill himself. He also states he wants [place] to deport him and get him back into [place]. He states he has been on some form of medication for  mental illness but he doesn't know the name of the medicines and he states they're not helping. He denies ever being in a psychiatric hospital before. The patient became very agitated right before I got to the room tore out his IV and was trying to leave and security had to be called. He is under IVC. Past Psychiatric History: He has had several suicide attempts in the past.  Interval history: Patient appeared lying in his bed without any distress. Patient safety sitter is at bedside reportedly patient has been cooperative with the treatment and has no afferent behavioral or emotional problems. Patient has no reported irritability, agitation or aggressive behaviors. Reviewed psychiatric social service evaluation which indicated patient has a previous suicidal attempt when he was living in [place] and also recent to acute suicidal attempts as reported by staying in [place]. Patient is currently consistent undocumented immigrant, unemployed, medically disabled, homeless, limited to no family and friend support. Patient has a limited [place] proficiency and mostly speaks [place].  Patient has history of separation about 3 years ago from his significant other 6 years. Patient has been living in shelters for several months and feels being tired of living that way. Patient has a history of substance abuse including cocaine, marijuana and alcohol. Patient has history of incarceration 15 days for driving under influence. Patient has no previous history of acute psychiatric hospitalization but received psychiatric services while admitted for knee surgery in [facility] secondary to jumping out of the tree.  Patient is not able to follow outpatient mental health care. Patient has been possibly responding to his current medication regimen which is a Haldol, Ativan and Cogentin.   Patient continued to report depression and auditory hallucinations which are command in nature but less distressful to him due to  being on medication management. Patient denies current suicidal ideation and request to contact immigration office for possible deportation secondary to he failed to live in [place].   Patient considered to be unsafe secondary to recent suicidal attempts and also has no psychosocial support in local community. Patient is able to understand that they need to be hospitalized. Patient continued to meet criteria for acute psychiatric hospitalization and psychiatric social service has been working for placement at this time.     Risk to Self: Is patient at risk for suicide?: Yes Risk to Others:   Prior Inpatient Therapy:   Prior Outpatient Therapy:    Past Medical History:  Past Medical History  Diagnosis Date  . Hearing voices     Pt does not know exactly what it is but he does hear voices    Past Surgical History  Procedure Laterality Date  . Fracture surgery Right 2016    right lower leg - August   . Back surgery  August   . I&d extremity Right 10/05/2015    Procedure: IRRIGATION AND DEBRIDEMENT RIGHT WRIST; REPAIR OF MULTIPLE FLEXOR TENDONS; MEDIAL NERVE NEUROLYSIS;  Surgeon: Dominica Severin, MD;  Location: [facility] OR;  Service: Orthopedics;  Laterality: Right;   Family History: History reviewed. No pertinent family history. Family Psychiatric  History: none Social History:  History  Alcohol Use No     History  Drug Use No    Social History   Social History  . Marital Status: Single    Spouse Name: N/A  . Number of Children: N/A  . Years of Education: N/A   Social History Main Topics  . Smoking status: Current Some Day Smoker  . Smokeless tobacco: Never Used  . Alcohol Use: No  . Drug Use: No  . Sexual Activity: Not Asked   Other Topics Concern  . None   Social History Narrative   Additional Social History:                          Allergies:  No Known Allergies  Labs:  No results found for this or any previous visit (from the past 48  hour(s)).  Current Facility-Administered Medications  Medication Dose Route Frequency Provider Last Rate Last Dose  . benztropine (COGENTIN) tablet 0.5 mg  0.5 mg Oral BID Myrlene Broker, MD   0.5 mg at 10/10/15 1052  . cephALEXin (KEFLEX) capsule 500 mg  500 mg Oral 4 times per day Dominica Severin, MD   500 mg at 10/10/15 1246  . docusate sodium (COLACE) capsule 100 mg  100 mg Oral BID Dominica Severin, MD   100 mg at 10/10/15 1052  . famotidine (PEPCID) tablet 20 mg  20 mg Oral BID PRN Dominica Severin, MD      . haloperidol (HALDOL) tablet 5 mg  5 mg Oral BID Myrlene Broker, MD   5 mg at 10/10/15 1052  . lactated ringers infusion   Intravenous Continuous Dominica Severin, MD 75 mL/hr at 10/06/15 0600    . LORazepam (ATIVAN) tablet 1 mg  1 mg Oral BID Myrlene Broker, MD   1 mg at 10/10/15 1052  . methocarbamol (  ROBAXIN) tablet 500 mg  500 mg Oral Q6H PRN Dominica SeverinWilliam Gramig, MD   500 mg at 10/06/15 0146   Or  . methocarbamol (ROBAXIN) 500 mg in dextrose 5 % 50 mL IVPB  500 mg Intravenous Q6H PRN Dominica SeverinWilliam Gramig, MD      . morphine 2 MG/ML injection 1 mg  1 mg Intravenous Q2H PRN Dominica SeverinWilliam Gramig, MD      . ondansetron [facility](ZOFRAN) tablet 4 mg  4 mg Oral Q6H PRN Dominica SeverinWilliam Gramig, MD       Or  . ondansetron [facility](ZOFRAN) injection 4 mg  4 mg Intravenous Q6H PRN Dominica SeverinWilliam Gramig, MD      . oxyCODONE (Oxy IR/ROXICODONE) immediate release tablet 5-10 mg  5-10 mg Oral Q3H PRN Dominica SeverinWilliam Gramig, MD   10 mg at 10/06/15 1029  . promethazine (PHENERGAN) suppository 12.5 mg  12.5 mg Rectal Q6H PRN Dominica SeverinWilliam Gramig, MD      . vitamin C (ASCORBIC ACID) tablet 1,000 mg  1,000 mg Oral Daily Dominica SeverinWilliam Gramig, MD   500 mg at 10/10/15 1052    Musculoskeletal: Strength & Muscle Tone: within normal limits Gait & Station: normal Patient leans: N/A  Psychiatric Specialty Exam: Review of Systems :   Blood pressure 100/64, pulse 59, temperature 97.7 F (36.5 C), temperature source Oral, resp. rate 17, height 5\' 6"  (1.676 m), weight 60.374 kg  (133 lb 1.6 oz), SpO2 99 %.Body mass index is 21.49 kg/(m^2).  General Appearance: Disheveled and Guarded  Eye SolicitorContact::  Fair  Speech:  Garbled  Volume:  Decreased  Mood:  Anxious and depressed   Affect:  Constricted and Depressed  Thought Process:  Disorganized  Orientation:  Full (Time, Place, and Person)  Thought Content:  Delusions, Hallucinations: Auditory Command:  Telling him to harm himself, Obsessions and Rumination      Suicidal Thoughts:  Yes.  with intent/plan  Homicidal Thoughts:  No  Memory:  Immediate;   Fair Recent;   Fair Remote;   Fair  Judgement:  Impaired  Insight:  Lacking  Psychomotor Activity:  decreased    Concentration:  Fair  Recall:  Poor  Fund of Knowledge:Fair  Language: Good  Akathisia:  No  Handed:  Right  AIMS (if indicated):     Assets:  Communication Skills Resilience  ADL's:  Intact  Cognition: WNL  Sleep:      Treatment Plan Summary: Daily contact with patient to assess and evaluate symptoms and progress in treatment and Medication management Patient continued to meet criteria for acute psychiatric hospitalization for crisis stabilization, safety monitoring Continue involuntary commitment as patient has been suicidal and has a status post suicidal attempts Continue safety sitter as patient has been suicidal and has a repeated attempts to kill himself   [facility] and also obtaining [facility] from the [facility] Continue Haldol 5 mg twice daily for psychosis Continue Ativan 1 mg twice daily for anxiety  Continue Cogentin 0.5 mg twice daily for EPS  Disposition: Recommend psychiatric Inpatient admission when medically cleared. Appreciate psychiatric consultation and follow up as clinically required Please contact [phone] or [phone] if needs further assistance  Lori Popowski,JANARDHAHA R. 10/10/2015 1:00 PM

## 2015-10-10 NOTE — Clinical Social Work Psych Note (Signed)
Covering Psych CSW [facility]  Marcelline Deistmily Niani Mourer, LCSW [phone] Orthopedics: [phone] Surgical: [phone]

## 2015-10-10 NOTE — Progress Notes (Signed)
Patient ID: Stephen [name], male   DOB: 11/22/1985, 29 y.o.   MRN: [id number] Is neurovascularly intact He is stable from an orthopedic standpoint. He has no complaints tonight We are awaiting transfer to a appropriate psychiatric facility Please see details in chart [name] M.D.

## 2015-10-11 ENCOUNTER — Inpatient Hospital Stay (HOSPITAL_COMMUNITY)
Admission: AD | Admit: 2015-10-11 | Payer: No Typology Code available for payment source | Source: Intra-hospital | Admitting: Psychiatry

## 2015-10-11 NOTE — Progress Notes (Signed)
Patient ID: Stephen [name], male   DOB: 12/11/1985, 29 y.o.   MRN: [id number] Stable  No changes Awaiting placement The patient is alert and oriented in no acute distress. The patient complains of minimal pain in the affected upper extremity.  The patient is noted to have a normal HEENT exam. Lung fields show equal chest expansion and no shortness of breath. Abdomen exam is nontender without distention. Lower extremity examination does not show any fracture dislocation or blood clot symptoms. Pelvis is stable and the neck and back are stable and nontender. Allin Frix MD

## 2015-10-11 NOTE — Clinical Social Work Psych Note (Signed)
Covering Psych CSW confirmed [facility]  Marcelline Deistmily Jaimeson Gopal, [place] [phone] Orthopedics: [phone] Surgical: [phone]

## 2015-10-11 NOTE — Progress Notes (Addendum)
Pt to remain on [facility].  [facility]    [facility]. Can be transferred in the morning after 0800. Rosey BathKelly Southard, RN

## 2015-10-12 NOTE — Clinical Social Work Note (Addendum)
[facility]  Nurse case manager confirmed [facility]. IVC paperwork updated and faxed to magistrates office.    IVC paperwork completed faxed this evening and CSW received call from Magistrate's office to conform patient's room number so that patient can be served.    Genelle BalVanessa Kaede Clendenen, MSW, LCSW Licensed Clinical Social Worker Clinical Social Work Department [facility] [phone]

## 2015-10-12 NOTE — Care Management Note (Signed)
Case Management Note  Patient Details  Name: Stephen [name] MRN: [id number] Date of Birth: 10/19/1986  Subjective/Objective:     Confirmed that pt [facility] via [facility].                Action/Plan:   Expected Discharge Date:                  Expected Discharge Plan:  Psychiatric Hospital  In-House Referral:  Clinical Social Work  Discharge planning Services  NA  Post Acute Care Choice:    Choice offered to:     DME Arranged:    DME Agency:     HH Arranged:    HH Agency:     Status of Service:  In process, will continue to follow  Medicare Important Message Given:    Date Medicare IM Given:    Medicare IM give by:    Date Additional Medicare IM Given:    Additional Medicare Important Message give by:     If discussed at Long Length of Stay Meetings, dates discussed:    Additional Comments:  Yvone NeuCrutchfield, Ruthetta Koopmann M, RN 10/12/2015, 2:45 PM

## 2015-10-12 NOTE — Progress Notes (Signed)
Patient's [facility] in [facility] in cabinet outside of room, per [place] NT

## 2015-10-12 NOTE — Consult Note (Signed)
[facility] Face-to-Face Psychiatry Consult   Reason for Consult:  Suicide attempt, psychosis Referring Physician:  Dr. Amanda Pea Patient Identification: Stephen [name] MRN:  [id number] Principal Diagnosis: Severe recurrent major depressive disorder with psychotic features [facility]) Diagnosis:   Patient Active Problem List   Diagnosis Date Noted  . Severe recurrent major depressive disorder with psychotic features (HCC) [F33.3] 10/08/2015  . Laceration of wrist with complication [S61.519A] 10/06/2015    Total Time spent with patient: 30 minutes  Subjective:   Stephen [name] is a 28 y.o. male patient admitted with  lacceration to right wrist  HPI:  Patient is a 29 year old Hispanic male from [place]. Interview was conducted via an interpreter. He states that he has been in the [place] for 8 or 9 years. He's currently not working. He states that his entire family lives in [place] and he only has 1 or 2 friends here. He states that he's had mental illness for several months. He wanted to kill himself by cutting himself with a saw and he injured his right wrist. A few months ago he tried to kill himself by jumping out of a tree and ended up with knee surgery to his right knee. He was hospitalized in the hospital helped him pay for a couple of months at a shelter which she claims is in [place]. The patient states that he hears voices telling him that he is either a devil or an angel. The voices are telling him to "either kill myself or to get married." He is obviously responding to internal stimuli. He is refusing his antibiotic medication. He was repeatedly told this was to help him prevent infection but he claims he doesn't need it and this is not the real problem. He states that the real problem is his mental illness. He has been depressed sad ruminating about how to kill himself. He also states he wants [place] to deport him and get him back into [place]. He states he has been on some form of medication for  mental illness but he doesn't know the name of the medicines and he states they're not helping. He denies ever being in a psychiatric hospital before. The patient became very agitated right before I got to the room tore out his IV and was trying to leave and security had to be called. He is under IVC. Past Psychiatric History: He has had several suicide attempts in the past.  Interval history: Patient stated that he has been in contact with his friend and given phone number which was shared with units social service. Patient involuntary commitment status will be expedited so will be precertified by the social service. Patient has been compliant with his medication without adverse affects. Patient reportedly wish to go and stay with his friend who can provide psychosocial support. Patient reports he wants to go and get out patient treatment itself inpatient hospitalization. Based on previous suicidal attempts twice, it is difficult to trust patient who does not have enough psychosocial support and is a psychosocial support was not clarified by the appropriate staff member at this time. Patient safety sitter is at bedside reportedly patient has been cooperative with the treatment and has no afferent behavioral or emotional problems. Patient has no reported irritability, agitation or aggressive behaviors. Reviewed psychiatric social service evaluation which indicated patient has a previous suicidal attempt when he was living in [place] and also recent to acute suicidal attempts as reported by staying in [place]. Patient is currently consistent undocumented immigrant, unemployed, medically disabled,  homeless, limited to no family and friend support. Patient has a limited [place] proficiency and mostly speaks [place].  Patient has history of separation about 3 years ago from his significant other 6 years. Patient has been living in shelters for several months and feels being tired of living that way. Patient  has a history of substance abuse including cocaine, marijuana and alcohol. Patient has history of incarceration 15 days for driving under influence. Patient has no previous history of acute psychiatric hospitalization but received psychiatric services while admitted for knee surgery in [facility] secondary to jumping out of the tree. Patient is not able to follow outpatient mental health care. Patient has been possibly responding to his current medication regimen which is a Haldol, Ativan and Cogentin. Patient denies current suicidal ideation and request to contact immigration office for possible deportation secondary to he failed to live in [place].   Patient considered to be unsafe secondary to recent suicidal attempts and also has no psychosocial support in local community. Patient continued to meet criteria for acute psychiatric hospitalization and psychiatric social service has been working for placement at this time.     Risk to Self: Is patient at risk for suicide?: Yes Risk to Others:   Prior Inpatient Therapy:   Prior Outpatient Therapy:    Past Medical History:  Past Medical History  Diagnosis Date  . Hearing voices     Pt does not know exactly what it is but he does hear voices    Past Surgical History  Procedure Laterality Date  . Fracture surgery Right 2016    right lower leg - August   . Back surgery  August   . I&d extremity Right 10/05/2015    Procedure: IRRIGATION AND DEBRIDEMENT RIGHT WRIST; REPAIR OF MULTIPLE FLEXOR TENDONS; MEDIAL NERVE NEUROLYSIS;  Surgeon: Dominica Severin, MD;  Location: [facility] OR;  Service: Orthopedics;  Laterality: Right;   Family History: History reviewed. No pertinent family history. Family Psychiatric  History: none Social History:  History  Alcohol Use No     History  Drug Use No    Social History   Social History  . Marital Status: Single    Spouse Name: N/A  . Number of Children: N/A  . Years of Education: N/A   Social  History Main Topics  . Smoking status: Current Some Day Smoker  . Smokeless tobacco: Never Used  . Alcohol Use: No  . Drug Use: No  . Sexual Activity: Not Asked   Other Topics Concern  . None   Social History Narrative   Additional Social History:                          Allergies:  No Known Allergies  Labs:  No results found for this or any previous visit (from the past 48 hour(s)).  Current Facility-Administered Medications  Medication Dose Route Frequency Provider Last Rate Last Dose  . benztropine (COGENTIN) tablet 0.5 mg  0.5 mg Oral BID Myrlene Broker, MD   0.5 mg at 10/12/15 1053  . cephALEXin (KEFLEX) capsule 500 mg  500 mg Oral 4 times per day Dominica Severin, MD   500 mg at 10/12/15 0544  . docusate sodium (COLACE) capsule 100 mg  100 mg Oral BID Dominica Severin, MD   100 mg at 10/12/15 1053  . famotidine (PEPCID) tablet 20 mg  20 mg Oral BID PRN Dominica Severin, MD      . haloperidol (  HALDOL) tablet 5 mg  5 mg Oral BID Myrlene Broker, MD   5 mg at 10/12/15 1053  . lactated ringers infusion   Intravenous Continuous Dominica Severin, MD 75 mL/hr at 10/06/15 0600    . LORazepam (ATIVAN) tablet 1 mg  1 mg Oral BID Myrlene Broker, MD   1 mg at 10/12/15 1053  . methocarbamol (ROBAXIN) tablet 500 mg  500 mg Oral Q6H PRN Dominica Severin, MD   500 mg at 10/06/15 0146   Or  . methocarbamol (ROBAXIN) 500 mg in dextrose 5 % 50 mL IVPB  500 mg Intravenous Q6H PRN Dominica Severin, MD      . morphine 2 MG/ML injection 1 mg  1 mg Intravenous Q2H PRN Dominica Severin, MD      . ondansetron [facility]) tablet 4 mg  4 mg Oral Q6H PRN Dominica Severin, MD       Or  . ondansetron [facility]) injection 4 mg  4 mg Intravenous Q6H PRN Dominica Severin, MD      . oxyCODONE (Oxy IR/ROXICODONE) immediate release tablet 5-10 mg  5-10 mg Oral Q3H PRN Dominica Severin, MD   10 mg at 10/06/15 1029  . promethazine (PHENERGAN) suppository 12.5 mg  12.5 mg Rectal Q6H PRN Dominica Severin, MD      . vitamin C  (ASCORBIC ACID) tablet 1,000 mg  1,000 mg Oral Daily Dominica Severin, MD   1,000 mg at 10/12/15 1053    Musculoskeletal: Strength & Muscle Tone: within normal limits Gait & Station: normal Patient leans: N/A  Psychiatric Specialty Exam: Review of Systems :   Blood pressure 91/57, pulse 65, temperature 97.9 F (36.6 C), temperature source Oral, resp. rate 16, height 5\' 6"  (1.676 m), weight 60.374 kg (133 lb 1.6 oz), SpO2 100 %.Body mass index is 21.49 kg/(m^2).  General Appearance: Disheveled and Guarded  Eye Solicitor::  Fair  Speech:  Garbled  Volume:  Decreased  Mood:  Anxious and depressed   Affect:  Constricted and Depressed  Thought Process:  Disorganized  Orientation:  Full (Time, Place, and Person)  Thought Content:  Delusions, Hallucinations: Auditory Command:  Telling him to harm himself, Obsessions and Rumination      Suicidal Thoughts:  Yes.  with intent/plan  Homicidal Thoughts:  No  Memory:  Immediate;   Fair Recent;   Fair Remote;   Fair  Judgement:  Impaired  Insight:  Lacking  Psychomotor Activity:  Normal    Concentration:  Fair  Recall:  Poor  Fund of Knowledge:Fair  Language: Good  Akathisia:  No  Handed:  Right  AIMS (if indicated):     Assets:  Communication Skills Resilience  ADL's:  Intact  Cognition: WNL  Sleep:      Treatment Plan Summary: Daily contact with patient to assess and evaluate symptoms and progress in treatment and Medication management Patient continued to meet criteria for acute psychiatric hospitalization for crisis stabilization, safety monitoring Continue involuntary commitment as patient has been suicidal and has a status post suicidal attempts Units social service will update the involuntary commitment status. Continue Recruitment consultant as patient has been suicidal and has a repeated attempts to kill himself   [facility]  Continue Haldol 5 mg twice daily for psychosis Continue Ativan 1  mg twice daily for anxiety  Continue Cogentin 0.5 mg twice daily for EPS  Disposition: Recommend psychiatric Inpatient admission when medically cleared. Appreciate psychiatric consultation and follow up as clinically required Please contact  [phone] or [phone] if needs further assistance  Noam Karaffa,JANARDHAHA R. 10/12/2015 11:19 AM

## 2015-10-12 NOTE — Progress Notes (Signed)
Patient ID: Stephen [name], male   DOB: 03/29/1986, 29 y.o.   MRN: [id number] Patient is stable.  Patient notes no new complaints.  He is actually in good spirits today.  His bandages clean dry and intact.  I will plan to change his bandage tomorrow.  I discussed with psychiatry his issues. Although I'm the attending physician his only issue is the suicidal ideations and psychiatric problems which is holding him here. Certainly he represents a gentleman who needs inpatient psychiatric care however this is not available at the time.  This is certainly outside of my expertise and I would defer to psychiatry.  I have signed the appropriate documents at the request of psychiatry and social work to keep him here until a bed is available so that he can obtain proper psychiatric services.  I discussed all issues with patient at bedside. He is calm and stable.  Kyre Jeffries M.D.

## 2015-10-13 NOTE — Progress Notes (Signed)
Patient ID: Stephen [name], male   DOB: 06/25/1986, 29 y.o.   MRN: [id number] Patient alert and oriented.  Patient has no complicating features in regards to his wound.  I removed his dressing and reapplied a dressing. We'll remove his sutures in a week. This can be done as an outpatient or inpatient pending his psychiatric status.  I once again explained to him the issues.  We will continue along the recommendations of psychiatry  The patient is alert and oriented in no acute distress. The patient complains of pain in the affected upper extremity.  The patient is noted to have a normal HEENT exam. Lung fields show equal chest expansion and no shortness of breath. Abdomen exam is nontender without distention. Lower extremity examination does not show any fracture dislocation or blood clot symptoms. Pelvis is stable and the neck and back are stable and nontender.  Roizy Harold M.D.

## 2015-10-13 NOTE — Social Work (Signed)
CSW contacted [facility] patient continues on Waitlist.  Beverly Sessionsywan J Adore Kithcart MSW, LCSW

## 2015-10-14 NOTE — Progress Notes (Signed)
Patient ID: Stephen [name], male   DOB: 12/01/1985, 29 y.o.   MRN: [id number] Stable  No changes Awaiting placement Stephen Kindred MD

## 2015-10-14 NOTE — Progress Notes (Signed)
Pt very friendly and animated. Denies any thoughts of suicide or hurting himself. States that he wants to go home with his cousin and then eventually to return to [place]. He ambulates frequently in the hall with the sitter. He reports that his pain is minimal.

## 2015-10-15 NOTE — Progress Notes (Signed)
Subjective: 10 Days Post-Op Procedure(s) (LRB): IRRIGATION AND DEBRIDEMENT RIGHT WRIST; REPAIR OF MULTIPLE FLEXOR TENDONS; MEDIAL NERVE NEUROLYSIS (Right) Patient reports pain as minimal. He has no complaints. He questions when he will be able to be discharged. He states he wants to go to his cousin's house. I have discussed with him all issues and the fact that we are currently awaiting placement in a different facility to hopefully treat him with his significant depression. Currently from an upper extremity standpoint he is stable.  Objective: Vital signs in last 24 hours: Temp:  [97.4 F (36.3 C)-98.2 F (36.8 C)] 97.4 F (36.3 C) (12/05 0643) Pulse Rate:  [83] 83 (12/05 0643) Resp:  [16-17] 16 (12/05 0643) BP: (91-93)/(47-54) 93/54 mmHg (12/05 0643) SpO2:  [98 %-100 %] 100 % (12/05 0643)  Intake/Output from previous day: 12/04 0701 - 12/05 0700 In: 676 [P.O.:676] Out: -  Intake/Output this shift:    No results for input(s): HGB in the last 72 hours. No results for input(s): WBC, RBC, HCT, PLT in the last 72 hours. No results for input(s): NA, K, CL, CO2, BUN, CREATININE, GLUCOSE, CALCIUM in the last 72 hours. No results for input(s): LABPT, INR in the last 72 hours.  He is pleasant, in no acute distress conversant and appears to speak rather good English Right upper extremity: Dressings are clean and intact. Digits are warm and profuse excellent range of motion present.  Assessment/Plan: 10 Days Post-Op Procedure(s) (LRB): IRRIGATION AND DEBRIDEMENT RIGHT WRIST; REPAIR OF MULTIPLE FLEXOR TENDONS; MEDIAL NERVE NEUROLYSIS (Right) Patient continues to be stable per upper extremity currently waiting on the placement behavioral health issues. Further plan per behavioral health/psychiatry. I have discussed with the patient will need to proceed with a wound check and suture removal in one week.  Jazmarie Biever L 10/15/2015, 7:46 AM

## 2015-10-15 NOTE — Progress Notes (Signed)
Patient ID: Stephen [name], male   DOB: 01/31/1986, 29 y.o.   MRN: [id number] Stable Awaiting placement The patient is alert and oriented in no acute distress. The patient complains of pain in the affected upper extremity.  The patient is noted to have a normal HEENT exam. Lung fields show equal chest expansion and no shortness of breath. Abdomen exam is nontender without distention. Lower extremity examination does not show any fracture dislocation or blood clot symptoms. Pelvis is stable and the neck and back are stable and nontender. Beckie Viscardi MD

## 2015-10-15 NOTE — Clinical Social Work Note (Signed)
This CSW covering for psychiatric CSW received phone number from psychiatry for a friend of the patient named June LeapMiguel Bacigalupo [phone] who patient reports he can stay with once he leaves the hospital.  CSW attempted to call patient's friend, but the phone number did not go through after a few attempts.  This covering CSW received a phone call from Janae SauceWendy Huffman Nurse Coordinator for [employer] who was confirming patient is still [facility].  CSW spoke with psychiatry who confirmed the patient's discharge plan is still to [facility] once a bed is available.  [facility] who confirmed patient is still on the wait list.  CSW continuing to follow patient's progress.  Ervin KnackEric R. Xsavier Seeley, MSW, Theresia MajorsLCSWA [phone] 10/15/2015 6:03 PM

## 2015-10-15 NOTE — Consult Note (Signed)
[facility]   Reason for Consult:  Suicide attempt, psychosis Referring Physician:  Dr. Amanda PeaGramig Patient Identification: Stephen [name] MRN:  [id number] Principal Diagnosis: Severe recurrent major depressive disorder with psychotic features [facility](HCC) Diagnosis:   Patient Active Problem List   Diagnosis Date Noted  . Severe recurrent major depressive disorder with psychotic features (HCC) [F33.3] 10/08/2015  . Laceration of wrist with complication [S61.519A] 10/06/2015    Total Time spent with patient: 30 minutes  Subjective:   Stephen [name] is a 29 y.o. male patient admitted with  lacceration to right wrist  HPI:  Patient is a 29 year old Hispanic male from [place]. Interview was conducted via an interpreter. He states that he has been in the [place] 8 [place] 9 years. He's currently not working. He states that his entire family lives in [place] and he only has 1 or 2 friends here. He states that he's had mental illness for several months. He wanted to kill himself by cutting himself with a saw and he injured his right wrist. A few months ago he tried to kill himself by jumping out of a tree and ended up with knee surgery to his right knee. He was hospitalized in the hospital helped him pay for a couple of months at a shelter which she claims is in [place]. The patient states that he hears voices telling him that he is either a devil or an angel. The voices are telling him to "either kill myself or to get married." He is obviously responding to internal stimuli. He is refusing his antibiotic medication. He was repeatedly told this was to help him prevent infection but he claims he doesn't need it and this is not the real problem. He states that the real problem is his mental illness. He has been depressed sad ruminating about how to kill himself. He also states he wants us to deport him and get him back into [place]. He states he has been on some form of medication for  mental illness but he doesn't know the name of the medicines and he states they're not helping. He denies ever being in a psychiatric hospital before. The patient became very agitated right before I got to the room tore out his IV and was trying to leave and security had to be called. He is under IVC. Past Psychiatric History: He has had several suicide attempts in the past.  Interval history: Patient has no new complaints today stated that he has been in contact with his friend/cousin and given phone number which was shared with units social service. It is not clear for me why unit social service did not contact phone number provided to contact his friend/cousin for possible psychosocial support. Appreciate [facility] service who updated his involuntary commitment petition which was signed by Dr. Amanda PeaGramig. I have spoken with Dr. Amanda PeaGramig make an provided support needed to complete the IVC documentation on Friday.   Patient has been compliant with his medication without adverse affects. Patient reportedly wish to be discharged and stay with his friend who can provide psychosocial support. There is no evidence that patient friend is available and able to care for him at this time. Waiting for these [facility] service to get a written consent from the patient and contact his friend to obtain possible psychosocial support. As per the available medical records, [facility] patient does not have any support system locally.   Based on previous suicidal attempts twice, it is difficult to trust patient who  does not have enough psychosocial support. Patient safety sitter is at bedside reportedly patient has been cooperative with the treatment and has no afferent behavioral or emotional problems. Reviewed psychiatric social service evaluation which indicated patient has a previous suicidal attempt when he was living in [place] and also recent to acute suicidal attempts while staying in [place].   Patient is  currently consistent undocumented immigrant, unemployed, medically disabled, homeless, limited to no family and friend support. Patient has a limited [place] proficiency and mostly speaks [place].  Patient has history of separation about 3 years ago from his significant other 6 years. Patient has been living in shelters for several months and feels being tired of living that way. Patient has a history of substance abuse including cocaine, marijuana and alcohol. Patient has history of incarceration 15 days for driving under influence. Patient has no previous history of acute psychiatric hospitalization but received psychiatric services while admitted for knee surgery in [facility] secondary to jumping out of the tree. Patient is not able to follow outpatient mental health care. Patient has been possibly responding to his current medication regimen which is a Haldol, Ativan and Cogentin. Patient denies current suicidal ideation and request to contact immigration office for possible deportation secondary to he failed to live in [place].   Patient considered to be unsafe secondary to recent suicidal attempts and also has no psychosocial support in local community. Patient continued to meet criteria for acute psychiatric hospitalization and psychiatric social service has been working for placement at this time.     Risk to Self: Is patient at risk for suicide?: Yes Risk to Others:   Prior Inpatient Therapy:   Prior Outpatient Therapy:    Past Medical History:  Past Medical History  Diagnosis Date  . Hearing voices     Pt does not know exactly what it is but he does hear voices    Past Surgical History  Procedure Laterality Date  . Fracture surgery Right 2016    right lower leg - August   . Back surgery  August   . I&d extremity Right 10/05/2015    Procedure: IRRIGATION AND DEBRIDEMENT RIGHT WRIST; REPAIR OF MULTIPLE FLEXOR TENDONS; MEDIAL NERVE NEUROLYSIS;  Surgeon: Dominica Severin,  MD;  Location: [facility] OR;  Service: Orthopedics;  Laterality: Right;   Family History: History reviewed. No pertinent family history. Family Psychiatric  History: none Social History:  History  Alcohol Use No     History  Drug Use No    Social History   Social History  . Marital Status: Single    Spouse Name: N/A  . Number of Children: N/A  . Years of Education: N/A   Social History Main Topics  . Smoking status: Current Some Day Smoker  . Smokeless tobacco: Never Used  . Alcohol Use: No  . Drug Use: No  . Sexual Activity: Not Asked   Other Topics Concern  . None   Social History Narrative   Additional Social History:                          Allergies:  No Known Allergies  Labs:  No results found for this or any previous visit (from the past 48 hour(s)).  Current Facility-Administered Medications  Medication Dose Route Frequency Provider Last Rate Last Dose  . benztropine (COGENTIN) tablet 0.5 mg  0.5 mg Oral BID Myrlene Broker, MD   0.5 mg at 10/15/15 0959  .  cephALEXin (KEFLEX) capsule 500 mg  500 mg Oral 4 times per day Dominica Severin, MD   500 mg at 10/15/15 1055  . docusate sodium (COLACE) capsule 100 mg  100 mg Oral BID Dominica Severin, MD   100 mg at 10/15/15 0959  . famotidine (PEPCID) tablet 20 mg  20 mg Oral BID PRN Dominica Severin, MD      . haloperidol (HALDOL) tablet 5 mg  5 mg Oral BID Myrlene Broker, MD   5 mg at 10/15/15 0959  . lactated ringers infusion   Intravenous Continuous Dominica Severin, MD 75 mL/hr at 10/06/15 0600    . LORazepam (ATIVAN) tablet 1 mg  1 mg Oral BID Myrlene Broker, MD   1 mg at 10/15/15 0959  . methocarbamol (ROBAXIN) tablet 500 mg  500 mg Oral Q6H PRN Dominica Severin, MD   500 mg at 10/13/15 0017   Or  . methocarbamol (ROBAXIN) 500 mg in dextrose 5 % 50 mL IVPB  500 mg Intravenous Q6H PRN Dominica Severin, MD      . morphine 2 MG/ML injection 1 mg  1 mg Intravenous Q2H PRN Dominica Severin, MD      . ondansetron [facility])  tablet 4 mg  4 mg Oral Q6H PRN Dominica Severin, MD       Or  . ondansetron [facility]) injection 4 mg  4 mg Intravenous Q6H PRN Dominica Severin, MD      . oxyCODONE (Oxy IR/ROXICODONE) immediate release tablet 5-10 mg  5-10 mg Oral Q3H PRN Dominica Severin, MD   5 mg at 10/13/15 2233  . promethazine (PHENERGAN) suppository 12.5 mg  12.5 mg Rectal Q6H PRN Dominica Severin, MD      . vitamin C (ASCORBIC ACID) tablet 1,000 mg  1,000 mg Oral Daily Dominica Severin, MD   1,000 mg at 10/15/15 1610    Musculoskeletal: Strength & Muscle Tone: within normal limits Gait & Station: normal Patient leans: N/A  Psychiatric Specialty Exam: Review of Systems :   Blood pressure 93/54, pulse 83, temperature 97.4 F (36.3 C), temperature source Oral, resp. rate 16, height  (1.676 m), weight 60.374 kg (133 lb 1.6 oz), SpO2 100 %.Body mass index is 21.49 kg/(m^2).  General Appearance: Disheveled and Guarded  Eye Solicitor::  Fair  Speech:  Garbled  Volume:  Decreased  Mood:  Anxious and depressed   Affect:  Constricted and Depressed  Thought Process:  Disorganized  Orientation:  Full (Time, Place, and Person)  Thought Content:  Delusions, Hallucinations: Auditory Command:  Telling him to harm himself, Obsessions and Rumination      Suicidal Thoughts:  Yes.  with intent/plan  Homicidal Thoughts:  No  Memory:  Immediate;   Fair Recent;   Fair Remote;   Fair  Judgement:  Impaired  Insight:  Lacking  Psychomotor Activity:  Normal    Concentration:  Fair  Recall:  Poor  Fund of Knowledge:Fair  Language: Good  Akathisia:  No  Handed:  Right  AIMS (if indicated):     Assets:  Communication Skills Resilience  ADL's:  Intact  Cognition: WNL  Sleep:      Treatment Plan Summary: Daily contact with patient to assess and evaluate symptoms and progress in treatment and Medication management  Patient continued to meet criteria for acute psychiatric hospitalization for crisis stabilization, safety  monitoring Patient involuntary commitment petition is up to date as of today Continue involuntary commitment as patient has been suicidal and has a status post  suicidal attempts Continue Recruitment consultant as patient has been suicidal and has a repeated attempts to kill himself Reportedly behavior health Hospital refused [facility]  Continue Haldol 5 mg twice daily for psychosis Continue Ativan 1 mg twice daily for anxiety  Continue Cogentin 0.5 mg twice daily for EPS  Disposition: Recommend psychiatric Inpatient admission when medically cleared. Appreciate psychiatric consultation and follow up as clinically required Please contact [phone] or [phone] if needs further assistance  Stephen [name],Stephen R. 10/15/2015 1:44 PM

## 2015-10-16 ENCOUNTER — Inpatient Hospital Stay (HOSPITAL_COMMUNITY)
Admission: AD | Admit: 2015-10-16 | Discharge: 2015-10-22 | DRG: 885 | Disposition: A | Discharge status: Home / Self Care | Payer: No Typology Code available for payment source | Source: Intra-hospital | Attending: Psychiatry | Admitting: Psychiatry

## 2015-10-16 ENCOUNTER — Encounter (HOSPITAL_COMMUNITY): Payer: Self-pay

## 2015-10-16 DIAGNOSIS — F1010 Alcohol abuse, uncomplicated: Secondary | ICD-10-CM | POA: Diagnosis present

## 2015-10-16 DIAGNOSIS — F17200 Nicotine dependence, unspecified, uncomplicated: Secondary | ICD-10-CM | POA: Diagnosis not present

## 2015-10-16 DIAGNOSIS — F1421 Cocaine dependence, in remission: Secondary | ICD-10-CM | POA: Clinically undetermined

## 2015-10-16 DIAGNOSIS — E221 Hyperprolactinemia: Secondary | ICD-10-CM | POA: Diagnosis present

## 2015-10-16 DIAGNOSIS — Z87891 Personal history of nicotine dependence: Secondary | ICD-10-CM | POA: Diagnosis not present

## 2015-10-16 DIAGNOSIS — F1490 Cocaine use, unspecified, uncomplicated: Secondary | ICD-10-CM | POA: Diagnosis present

## 2015-10-16 DIAGNOSIS — F333 Major depressive disorder, recurrent, severe with psychotic symptoms: Secondary | ICD-10-CM | POA: Diagnosis present

## 2015-10-16 DIAGNOSIS — S61519A Laceration without foreign body of unspecified wrist, initial encounter: Secondary | ICD-10-CM | POA: Diagnosis present

## 2015-10-16 DIAGNOSIS — F323 Major depressive disorder, single episode, severe with psychotic features: Secondary | ICD-10-CM | POA: Diagnosis present

## 2015-10-16 MED ORDER — ACETAMINOPHEN 325 MG PO TABS: 650.0000 mg | ORAL_TABLET | Freq: Four times a day (QID) | ORAL | Status: DC | PRN

## 2015-10-16 MED ORDER — IBUPROFEN 600 MG PO TABS
600.0000 mg | ORAL_TABLET | Freq: Four times a day (QID) | ORAL | Status: DC | PRN
  Administered 2015-10-17 – 2015-10-21 (×5): 600 mg via ORAL
  Filled 2015-10-16 (×5): qty 1

## 2015-10-16 MED ORDER — RISPERIDONE 2 MG PO TBDP: 2.0000 mg | ORAL_TABLET | Freq: Three times a day (TID) | ORAL | Status: DC | PRN

## 2015-10-16 MED ORDER — ALUM & MAG HYDROXIDE-SIMETH 200-200-20 MG/5ML PO SUSP: 30.0000 mL | ORAL | Status: DC | PRN

## 2015-10-16 MED ORDER — LORAZEPAM 1 MG PO TABS: 1.0000 mg | ORAL_TABLET | ORAL | Status: DC | PRN

## 2015-10-16 MED ORDER — TRAZODONE HCL 50 MG PO TABS
50.0000 mg | ORAL_TABLET | Freq: Every evening | ORAL | Status: DC | PRN
  Administered 2015-10-16 – 2015-10-21 (×9): 50 mg via ORAL
  Filled 2015-10-16: qty 1
  Filled 2015-10-16: qty 60
  Filled 2015-10-16 (×10): qty 1
  Filled 2015-10-16: qty 60
  Filled 2015-10-16 (×5): qty 1

## 2015-10-16 MED ORDER — ZIPRASIDONE MESYLATE 20 MG IM SOLR: 20.0000 mg | INTRAMUSCULAR | Status: DC | PRN

## 2015-10-16 MED ORDER — HYDROXYZINE HCL 25 MG PO TABS
25.0000 mg | ORAL_TABLET | Freq: Four times a day (QID) | ORAL | Status: DC | PRN
  Administered 2015-10-16: 25 mg via ORAL
  Filled 2015-10-16: qty 1
  Filled 2015-10-16: qty 20

## 2015-10-16 MED ORDER — MAGNESIUM HYDROXIDE 400 MG/5ML PO SUSP: 30.0000 mL | Freq: Every day | ORAL | Status: DC | PRN

## 2015-10-16 NOTE — Progress Notes (Signed)
SEARCH NOTE: Pt skin assessed (RT ace bandaged to arm, old surgical scar to lower back and RT leg), v/s taken, belongings searched. Pt leg brace given to Alona BeneJoyce, RN, along with report. Alona BeneJoyce informed that pt required documents will need to be explained to pt and signed. Four digit code number given to pt. Belonging sheet completed.

## 2015-10-16 NOTE — Progress Notes (Signed)
CSW arranged [facility] transportation to [facility].

## 2015-10-16 NOTE — Progress Notes (Signed)
Patient ID: Stephen [name], male   DOB: 08/26/1986, 29 y.o.   MRN: [id number] Admission Note: Patient on unit at beginning of shift.  VS, skin assessment,belongings completed by Patrice,RN. Spanish interpreter used  via phone to complete admission process.  Patient is a 29 year old male presenting today after a recent SI attempt. This is the second SI attempt in 6 months.  Patient has an ace wrap to right wrist.Patient cut wrist with a wood saw requiring tendon repair on 10/06/15.  Per report patient hearing voices to harm himself.  Pt currently denies and SI/HI/AVH. Also wears brace to right leg for support. Patient denies any verbal, sexual or physical abuse.  He also denied taking any meds at home. Pt stated he was feeling hopeless and suicidal  because he lost his job and he  was lonely.

## 2015-10-16 NOTE — Progress Notes (Signed)
Patient ID: Stephen [name], male   DOB: 11/12/1985, 29 y.o.   MRN: [id number] Patient was seen and examined at bedside.  I removed the patient's bandage and his sutures. Following this placed Steri-Strips over the laceration/table saw injury.  He looks quite well he has no signs of infection or other comp cutting feature.  I discussed with the patient I would recommend gentle range of motion and begin showers regularly in 2-3 days. We'll see him back in the office for follow-up in 2 weeks should any problems arise will be available.  He is going to be admitted to behavioral health it appears should any problems arise I'll be immediately available  Kahlen Boyde cell phone [phone]

## 2015-10-16 NOTE — Progress Notes (Signed)
Stephen [name] discharged to Behavior Health per MD order. All questions and concerns answered. Copy of instructions and scripts sent with patient.   [place] [employer]. No distress noted upon discharge.   Rosita FireScott, Donalee Gaumond R 10/16/2015 6:36 PM

## 2015-10-16 NOTE — Tx Team (Signed)
Initial Interdisciplinary Treatment Plan   PATIENT STRESSORS: Financial difficulties Occupational concerns Traumatic event   PATIENT STRENGTHS: Capable of independent living Physical Health Religious Affiliation Supportive family/friends   PROBLEM LIST: Problem List/Patient Goals Date to be addressed Date deferred Reason deferred Estimated date of resolution  "stop having thoughts to hurt myself" 10/16/15     Suicide risk 10/16/15     Depression 10/16/15     "I need to find a new job" 10/16/15                                    DISCHARGE CRITERIA:  Improved stabilization in mood, thinking, and/or behavior Medical problems require only outpatient monitoring Motivation to continue treatment in a less acute level of care Need for constant or close observation no longer present Safe-care adequate arrangements made Withdrawal symptoms are absent or subacute and managed without 24-hour nursing intervention  PRELIMINARY DISCHARGE PLAN: Attend aftercare/continuing care group Outpatient therapy Return to previous living arrangement Return to previous work or school arrangements  PATIENT/FAMIILY INVOLVEMENT: This treatment plan has been presented to and reviewed with the patient, Stephen [name]  The patient and family have been given the opportunity to ask questions and make suggestions.  Ellis ParentsJoyce S Morna Flud 10/16/2015, 11:16 PM

## 2015-10-17 ENCOUNTER — Encounter (HOSPITAL_COMMUNITY): Payer: Self-pay | Admitting: Psychiatry

## 2015-10-17 DIAGNOSIS — F1490 Cocaine use, unspecified, uncomplicated: Secondary | ICD-10-CM

## 2015-10-17 DIAGNOSIS — F17200 Nicotine dependence, unspecified, uncomplicated: Secondary | ICD-10-CM

## 2015-10-17 DIAGNOSIS — F1421 Cocaine dependence, in remission: Secondary | ICD-10-CM | POA: Clinically undetermined

## 2015-10-17 DIAGNOSIS — F333 Major depressive disorder, recurrent, severe with psychotic symptoms: Principal | ICD-10-CM

## 2015-10-17 DIAGNOSIS — F1010 Alcohol abuse, uncomplicated: Secondary | ICD-10-CM

## 2015-10-17 LAB — RAPID URINE DRUG SCREEN, HOSP PERFORMED
AMPHETAMINES: NOT DETECTED
Barbiturates: NOT DETECTED
Benzodiazepines: NOT DETECTED
Cocaine: NOT DETECTED
Opiates: NOT DETECTED
TETRAHYDROCANNABINOL: NOT DETECTED

## 2015-10-17 MED ORDER — FLUOXETINE HCL 20 MG PO CAPS
20.0000 mg | ORAL_CAPSULE | Freq: Every day | ORAL | Status: DC
  Administered 2015-10-17 – 2015-10-19 (×3): 20 mg via ORAL
  Filled 2015-10-17 (×6): qty 1

## 2015-10-17 MED ORDER — HALOPERIDOL 5 MG PO TABS
5.0000 mg | ORAL_TABLET | Freq: Every day | ORAL | Status: DC
  Administered 2015-10-17 – 2015-10-21 (×5): 5 mg via ORAL
  Filled 2015-10-17 (×2): qty 1
  Filled 2015-10-17: qty 30
  Filled 2015-10-17 (×4): qty 1

## 2015-10-17 MED ORDER — BENZTROPINE MESYLATE 0.5 MG PO TABS
0.5000 mg | ORAL_TABLET | Freq: Every day | ORAL | Status: DC
  Administered 2015-10-17 – 2015-10-21 (×5): 0.5 mg via ORAL
  Filled 2015-10-17: qty 1
  Filled 2015-10-17: qty 30
  Filled 2015-10-17 (×5): qty 1

## 2015-10-17 NOTE — Tx Team (Signed)
Interdisciplinary Treatment Plan Update (Adult)  Date:  10/17/2015 Time Reviewed:  11:24 AM  Progress in Treatment: Attending groups: Yes. Participating in groups: Yes. Taking medication as prescribed:  Yes. Tolerating medication:  Yes. Family/Significant othe contact made:  Yes Patient understands diagnosis:  Yes, as evidenced by seeking help with SI and depression. Discussing patient identified problems/goals with staff:  Yes, see initial care plan. Medical problems stabilized or resolved:  Yes Denies suicidal/homicidal ideation: Yes. Issues/concerns per patient self-inventory: No. Other:  New problem(s) identified:   Discharge Plan or Barriers: See below  Reason for Continuation of Hospitalization: Depression Hallucinations Medication stabilization Suicidal ideation  Comments: Patient is a 29 year old Hispanic male from [place]. Interview was conducted via an interpreter. He states that he has been in the [place] for 8 or 9 years. He's currently not working. He states that his entire family lives in [place] and he only has 1 or 2 friends here. He states that he's had mental illness for several months. He wanted to kill himself by cutting himself with a saw and he injured his right wrist. A few months ago he tried to kill himself by jumping out of a tree and ended up with knee surgery to his right knee. He was hospitalized in the hospital helped him pay for a couple of months at a shelter which she claims is in [place]. The patient states that he hears voices telling him that he is either a devil or an angel. The voices are telling him to "either kill myself or to get married." Prozac, Haldol, Desyrel trail  Estimated length of stay: 4-5 days  New goal(s):  Review of initial/current patient goals per problem list:  1. Goal(s): Patient will participate in aftercare plan  Met: No   Target date: at discharge  As evidenced by: Patient will participate within aftercare  plan AEB aftercare provider and housing plan at discharge being identified.  10/17/15: Discharge plan unknown. CSW still assessing.  2. Goal (s): Patient will exhibit decreased depressive symptoms and suicidal ideations.  Met:No  Target date: at discharge  As evidenced by: Patient will utilize self rating of depression at 3 or below and demonstrate decreased signs of depression or be deemed stable for discharge by MD.  10/17/15: Pt reported to the hospital for SI after a recent suicide attempt. Pt currently denies symptoms but is still a high suicide risk. Will continue to assess.   4. Goal(s): Patient will demonstrate decreased signs of psychosis.  Met: No  Target date:at discharge  As evidenced by: Patient will demonstrate decreased signs of psychosis as evidenced by a reduction in AVH, paranoia, and/or delusions.   10/17/15: When pt was admitted to the hospital pt reported that he was hearing voices that were telling him to kill himself. Pt currently denies AVH but CSW will continue to assess.  Willing to try medication.   Attendees: Patient:  10/17/2015 11:24 AM  Family:   10/17/2015 11:24 AM  Physician:  Dr. Ursula Alert, MD 10/17/2015 11:24 AM  Nursing: Manuella Ghazi, RN 10/17/2015 11:24 AM  Case Manager:  Roque Lias, LCSW 10/17/2015 11:24 AM  Counselor:  Matthew Saras, MSW Intern 10/17/2015 11:24 AM  Other:   10/17/2015 11:24 AM  Other:   10/17/2015 11:24 AM  Other:   10/17/2015 11:24 AM  Other:  10/17/2015 11:24 AM  Other:    Other:    Other:    Other:    Other:    Other:  Scribe for Treatment Team:   Georga Kaufmann, MSW Intern 10/17/2015 11:24 AM

## 2015-10-17 NOTE — Progress Notes (Addendum)
DAR Note: Stephen [name] has been visible on the unit.  He has been attending groups with interpreter by side.  He denies any suicidal thoughts at this time.  She denies A/V hallucinations.  He completed his self inventory and reports his depression 5/10, hopelessness and anxiety 0/10.  He reported that his goal for today is to "think more deeply in the things I do" and she will accomplish this goal by "rest and learn from all the teachings given to me."  He denies any pain at this time.  He states that his wrist hurts only when he moves it.  He reported that his doctor told him to take the dressing off in three days and to keep it dry.  Encouraged participation in group and unit activities.  Q 15 minute checks maintained for safety. We will continue to monitor the progress towards his goals.

## 2015-10-17 NOTE — Progress Notes (Signed)
Adult Psychoeducational Group Note  Date:  10/17/2015 Time:  9:48 PM  Group Topic/Focus:  Wrap-Up Group:   The focus of this group is to help patients review their daily goal of treatment and discuss progress on daily workbooks.  Participation Level:  Minimal  Participation Quality:  Appropriate  Affect:  Flat  Cognitive:  Oriented  Insight: Appropriate  Engagement in Group:  Engaged  Modes of Intervention:  Socialization and Support  Additional Comments:  Patient attended group with an interpreter. Through the interpreter he communicated. She reports having a good day. He went outside with the group. He felt better after he returned to the floor. Today he has been thinking about what was going to happen.  Lita MainsFrancis, Dustie Brittle [facility] 10/17/2015, 9:48 PM

## 2015-10-17 NOTE — H&P (Signed)
Psychiatric Admission Assessment Adult  Patient Identification: Stephen [name] MRN:  [id number] Date of Evaluation:  10/17/2015 Chief Complaint: Patient states " I was depressed and was hearing AH asking me to kill myself.'      Principal Diagnosis: Major depressive disorder, recurrent episode, severe, with psychosis (HCC) Diagnosis:   Patient Active Problem List   Diagnosis Date Noted  . Major depressive disorder, recurrent episode, severe, with psychosis (HCC) [F33.3] 10/17/2015  . Tobacco use disorder [F17.200] 10/17/2015  . Cocaine use disorder, moderate, in early remission [F14.90] 10/17/2015  . Alcohol use disorder, mild, abuse [F10.10] 10/17/2015  . Laceration of wrist with complication [S61.519A] 10/06/2015     History of Present Illness:: Patient is a 29 year old Hispanic male from [place], is employed , lives with a cousin in [place], who presented to [facility] S/P self inflicted wound to forearm - with a chain saw.   Per initial notes in EHR- pt was seen by Orthopedic consult team - pt underwent repair and suture . Pt thereafter was seen by Psychiatry consult team - and per notes in EHR "He states that his entire family lives in [place] and he only has 1 or 2 friends here. He states that he's had mental illness for several months. He wanted to kill himself by cutting himself with a saw and he injured his right wrist. A few months ago he tried to kill himself by jumping out of a tree and ended up with knee surgery to his right knee. He was hospitalized in the hospital helped him pay for a couple of months at a shelter which she claims is in [place]. The patient states that he hears voices telling him that he is either a devil or an angel. The voices are telling him to "either kill myself or to get married." He is obviously responding to internal stimuli. He is refusing his antibiotic medication. He was repeatedly told this was to help him prevent infection but he claims he doesn't need it  and this is not the real problem. He states that the real problem is his mental illness. He has been depressed sad ruminating about how to kill himself. He also states he wants [place] to deport him and get him back into [place]. He states he has been on some form of medication for mental illness but he doesn't know the name of the medicines and he states they're not helping. He denies ever being in a psychiatric hospital before. The patient became very agitated right before I got to the room tore out his IV and was trying to leave and security had to be called. He is under IVC."  Patient seen and chart reviewed today. Patient today was seen with the help of telephone interpreter - spanish - [id number]. Patient reports feeling depressed for a long time - worsening depression since the past week or so. Pt reports that his major trigger is his inability to see his family - his parents and his siblings are still in [place] and the last time he saw them was 11 years ago. 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 Pt reports he has a cousin in [place] - with whom he was staying for the past few days . Pt reports that a few days ago he started getting more and more depressed and started having AH asking him to kill self . Pt thereafter tried to cut self with a saw. Pt however currently reports that he  does not want to kill self any more and he regrets doing what he did. Pt reports he has set some goals to get back to work and set aside some money to return to [place].  Pt does report some sleep issues on and off. Pt otherwise denies any anhedonia, loss of appetite or other sx of depression. Pt reports that he currently does not have AH - but he did in the past. There were times when he could hear demons and felt like every one in the world had died and he wanted to die too. Pt reports that [facility] when this happened and was given medications. Pt does  not know the name of his medications and does not feel it was helpful.  Pt denies any hx of bipolar do sx, denies anxiety sx or hx of trauma.  Pt does report smoking cigarettes on and off , abuses cocaine - 100 $ worth at times - last use was 4 months ago. He reports he snorts it. Pt also reports abusing alcohol - 24- 40 oz , beers over the week ends - denies any withdrawal sx when not using it.      Associated Signs/Symptoms: Depression Symptoms:  depressed mood, insomnia, fatigue, hopelessness, suicidal thoughts with specific plan, suicidal attempt, (Hypo) Manic Symptoms:  Delusions, Hallucinations, Anxiety Symptoms:  denies Psychotic Symptoms:  Delusions, Hallucinations: Auditory- prior to admission PTSD Symptoms: Negative Total Time spent with patient: 1 hour  Past Psychiatric History: Patient report prior admission to hospital [facility]- was started on medications - does not know the name. Pt reports several suicide attempts in the past - cutting wrist , jumping of a tree - few months ago- required knee surgery. Pt is not compliant on medications.   Risk to Self: Is patient at risk for suicide?: Yes Risk to Others:   Prior Inpatient Therapy:   Prior Outpatient Therapy:    Alcohol Screening: 1. How often do you have a drink containing alcohol?: Never 2. How many drinks containing alcohol do you have on a typical day when you are drinking?: 1 or 2 3. How often do you have six or more drinks on one occasion?: Never Preliminary Score: 0 9. Have you or someone else been injured as a result of your drinking?: No 10. Has a relative or friend or a doctor or another health worker been concerned about your drinking or suggested you cut down?: No Alcohol Use Disorder Identification Test Final Score (AUDIT): 0 Brief Intervention: AUDIT score less than 7 or less-screening does not suggest unhealthy drinking-brief intervention not indicated Substance Abuse History in the last  12 months:  Yes.  as above - cocaine, alcohol  Consequences of Substance Abuse: Legal Consequences:  DWI - 4 years ago Previous Psychotropic Medications: Yes - does not know the name  Psychological Evaluations: No  Past Medical History:  Past Medical History  Diagnosis Date  . Hearing voices     Pt does not know exactly what it is but he does hear voices    Past Surgical History  Procedure Laterality Date  . Fracture surgery Right 2016    right lower leg - August   . Back surgery  August   . I&d extremity Right 10/05/2015    Procedure: IRRIGATION AND DEBRIDEMENT RIGHT WRIST; REPAIR OF MULTIPLE FLEXOR TENDONS; MEDIAL NERVE NEUROLYSIS;  Surgeon: Dominica SeverinWilliam Gramig, MD;  Location: [facility] OR;  Service: Orthopedics;  Laterality: Right;   Family  History:  Family History  Problem Relation Age of Onset  . Alcoholism Father    Family Psychiatric  History: Pt reports his father is an alcoholic. Pt denies any hx of mental illness, suicide in family.  Social History: Pt reports he is single , denies having any children. Pt currently lives with a cousin in [place]. Pt reports he used to work in [employer]. Pt reports hx of being in jail for DWI - few years ago. History  Alcohol Use No     History  Drug Use  . Yes  . Special: Cocaine, Marijuana    Comment: using cocainwe and thc.  Stopped 4 months ago    Social History   Social History  . Marital Status: Single    Spouse Name: N/A  . Number of Children: N/A  . Years of Education: N/A   Social History Main Topics  . Smoking status: Former Smoker -- 0.25 packs/day for 5 years    Types: Cigarettes  . Smokeless tobacco: Never Used  . Alcohol Use: No  . Drug Use: Yes    Special: Cocaine, Marijuana     Comment: using cocainwe and thc.  Stopped 4 months ago  . Sexual Activity: Yes    Birth Control/ Protection: None   Other Topics Concern  . None   Social History Narrative   Additional Social History:    History of alcohol / drug use?:  Yes Name of Substance 1: etoh 1 - Age of First Use: 16 1 - Amount (size/oz): 40oz daily 1 - Frequency: daily 1 - Duration: many years 1 - Last Use / Amount: 4 months ago Name of Substance 2: cocaine 2 - Age of First Use: 22 2 - Amount (size/oz): unknown 2 - Frequency: unknown 2 - Duration: 2 years 2 - Last Use / Amount: 4 months ago Name of Substance 3: THC 3 - Age of First Use: 16 3 - Amount (size/oz): unknown 3 - Frequency: several times a week 3 - Duration: years 3 - Last Use / Amount: 4 months              Allergies:  No Known Allergies Lab Results: No results found for this or any previous visit (from the past 48 hour(s)).  Metabolic Disorder Labs:  No results found for: HGBA1C, MPG No results found for: PROLACTIN No results found for: CHOL, TRIG, HDL, CHOLHDL, VLDL, LDLCALC  Current Medications: Current Facility-Administered Medications  Medication Dose Route Frequency Provider Last Rate Last Dose  . acetaminophen (TYLENOL) tablet 650 mg  650 mg Oral Q6H PRN Kerry Hough, PA-C      . alum & mag hydroxide-simeth (MAALOX/MYLANTA) 200-200-20 MG/5ML suspension 30 mL  30 mL Oral Q4H PRN Kerry Hough, PA-C      . FLUoxetine (PROZAC) capsule 20 mg  20 mg Oral Daily Hashem Goynes, MD   20 mg at 10/17/15 1147  . haloperidol (HALDOL) tablet 5 mg  5 mg Oral QHS Chianti Goh, MD      . hydrOXYzine (ATARAX/VISTARIL) tablet 25 mg  25 mg Oral Q6H PRN Kerry Hough, PA-C   25 mg at 10/16/15 2148  . ibuprofen (ADVIL,MOTRIN) tablet 600 mg  600 mg Oral Q6H PRN Kerry Hough, PA-C      . risperiDONE (RISPERDAL M-TABS) disintegrating tablet 2 mg  2 mg Oral Q8H PRN Kerry Hough, PA-C       And  . LORazepam (ATIVAN) tablet 1 mg  1 mg Oral PRN  Kerry Hough, PA-C       And  . ziprasidone (GEODON) injection 20 mg  20 mg Intramuscular PRN Kerry Hough, PA-C      . magnesium hydroxide (MILK OF MAGNESIA) suspension 30 mL  30 mL Oral Daily PRN Kerry Hough, PA-C       . traZODone (DESYREL) tablet 50 mg  50 mg Oral QHS,MR X 1 Kerry Hough, PA-C   50 mg at 10/16/15 2149   PTA Medications: No prescriptions prior to admission    Musculoskeletal: Strength & Muscle Tone: within normal limits Gait & Station: normal Patient leans: N/A  Psychiatric Specialty Exam: Physical Exam  Constitutional:  I concur with PE done in ED.    Review of Systems  Skin:       S/P self inflicted wound to right forearm - S/P suturing, dressing. Dressing noted as clean  Psychiatric/Behavioral: Positive for depression and hallucinations. The patient is nervous/anxious and has insomnia.   All other systems reviewed and are negative.   Blood pressure 99/66, pulse 93, temperature 98 F (36.7 C), temperature source Oral, resp. rate 16, height 5\' 3"  (1.6 m), weight 69.854 kg (154 lb).Body mass index is 27.29 kg/(m^2).  General Appearance: Fairly Groomed  Patent attorney::  Fair  Speech:  Normal Rate  Volume:  Normal  Mood:  Depressed  Affect:  Congruent  Thought Process:  Goal Directed  Orientation:  Full (Time, Place, and Person)  Thought Content:  Hallucinations: Auditory and Rumination had AH prior to admission  Suicidal Thoughts:  S/P severe suicide attempt - underwent surgery, repair currently denies  Homicidal Thoughts:  No  Memory:  Immediate;   Fair Recent;   Fair Remote;   Fair  Judgement:  Impaired  Insight:  Shallow  Psychomotor Activity:  Normal  Concentration:  Poor  Recall:  [employer] of Knowledge:Fair  Language: Fair  Akathisia:  No  Handed:  Right  AIMS (if indicated):     Assets:  Desire for Improvement  ADL's:  Intact  Cognition: WNL  Sleep:        Treatment Plan Summary: Daily contact with patient to assess and evaluate symptoms and progress in treatment and Medication management  Patient will benefit from inpatient treatment and stabilization.  Estimated length of stay is 5-7 days.  Reviewed past medical records,treatment plan.   Will  start a trial of Prozac 20 mg po daily for affective sx. Will Haldol 5 mg po qhs for psychosis. Will add Cogentin 0.5 mg po qhs for EPS. Will continue Trazodone 50 mg po qhs for sleep. Will make available PRN medications as per agitation protocol.  Will continue to monitor vitals ,medication compliance and treatment side effects while patient is here.  Will monitor for medical issues as well as call consult as needed.  Will continue dressing as per recommendation from Orthopedics consult - as per recommendations - Will consider contacting Gramig  MD - cell phone [phone]- IF NEEDED.   Reviewed labs , CBC, CMP - wnl , BAL <5, will get UDS- EKG, TSH, PL . CSW will start working on disposition.  Patient to participate in therapeutic milieu .       Observation Level/Precautions:  15 minute checks    Psychotherapy:  Individual and group therapy     Consultations:  Social worker  Discharge Concerns:  Stability and safety       I certify that inpatient services furnished can reasonably be expected to improve the  patient's condition.   Eloyse Causey MD 12/7/201612:41 PM

## 2015-10-17 NOTE — [facility] Group Notes (Signed)
[facility] LCSW Group Therapy  10/17/2015 2:09 PM  Type of Therapy: Group Therapy  Participation Level: Active  Participation Quality: Attentive  Affect: Flat  Cognitive: Oriented  Insight: Limited  Engagement in Therapy: Engaged  Modes of Intervention: Discussion and Socialization  Summary of Progress/Problems: Onalee HuaDavid from [employer] was here to tell his story of recovery and play his guitar. Pt was alert and pleasant throughout the group. Pt seemed to enjoy the songs that Onalee HuaDavid played.  Vito BackersLynn B. Beverely PaceBryant 10/17/2015 2:09 PM
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
## 2015-10-17 NOTE — [facility] Group Notes (Signed)
[facility]   10/17/2015 11:35 AM  Participation Quality:  None  Mood/Affect:  Appropriate  Depression Rating:    Anxiety Rating:    Thoughts of Suicide:  unknown Will you contract for safety?   unknown  Current AVH:  unknown  Plan for Discharge/Comments:  Hopped out of bed to join us for group.  Willing participant, but did not get to him before he was called out to see the Dr.  Did not return.  Transportation Means:   Supports:  Daryel GeraldNorth, Zaraya Delauder B

## 2015-10-17 NOTE — BHH Suicide Risk Assessment (Signed)
[facility]   Nursing information obtained from:  Patient Demographic factors:  Male Current Mental Status:  Suicidal ideation indicated by patient Loss Factors:  Financial problems / change in socioeconomic status Historical Factors:  Prior suicide attempts Risk Reduction Factors:  NA Total Time spent with patient: 30 minutes Principal Problem: Major depressive disorder, recurrent episode, severe, with psychosis (HCC) Diagnosis:   Patient Active Problem List   Diagnosis Date Noted  . Major depressive disorder, recurrent episode, severe, with psychosis (HCC) [F33.3] 10/17/2015  . Tobacco use disorder [F17.200] 10/17/2015  . Cocaine use disorder, moderate, in early remission [F14.90] 10/17/2015  . Alcohol use disorder, mild, abuse [F10.10] 10/17/2015  . Laceration of wrist with complication [S61.519A] 10/06/2015     Continued Clinical Symptoms:  Alcohol Use Disorder Identification Test Final Score (AUDIT): 0 The "Alcohol Use Disorders Identification Test", Guidelines for Use in Primary Care, Second Edition.  World Science writerHealth Organization [facility](WHO). Score between 0-7:  no or low risk or alcohol related problems. Score between 8-15:  moderate risk of alcohol related problems. Score between 16-19:  high risk of alcohol related problems. Score 20 or above:  warrants further diagnostic evaluation for alcohol dependence and treatment.   CLINICAL FACTORS:   Alcohol/Substance Abuse/Dependencies Unstable or Poor Therapeutic Relationship Previous Psychiatric Diagnoses and Treatments   Musculoskeletal: Strength & Muscle Tone: within normal limits Gait & Station: normal Patient leans: N/A  Psychiatric Specialty Exam: Physical Exam  Review of Systems  Psychiatric/Behavioral: Positive for depression, hallucinations and substance abuse. The patient has insomnia.   All other systems reviewed and are negative.   Blood pressure 99/66, pulse 93, temperature 98 F (36.7  C), temperature source Oral, resp. rate 16, height 5\' 3"  (1.6 m), weight 69.854 kg (154 lb).Body mass index is 27.29 kg/(m^2).                      Please see H&P.                                    COGNITIVE FEATURES THAT CONTRIBUTE TO RISK:  Closed-mindedness, Polarized thinking and Thought constriction (tunnel vision)    SUICIDE RISK:   Moderate:  Frequent suicidal ideation with limited intensity, and duration, some specificity in terms of plans, no associated intent, good self-control, limited dysphoria/symptomatology, some risk factors present, and identifiable protective factors, including available and accessible social support.  PLAN OF CARE: Please see H&P.   Medical Decision Making:  Review of Psycho-Social Stressors (1), Review or order clinical lab tests (1), Decision to obtain old records (1), Review and summation of old records (2), Established Problem, Worsening (2), Review of Last Therapy Session (1) and Review of New Medication or Change in Dosage (2)  I certify that inpatient services furnished can reasonably be expected to improve the patient's condition.   Tony Friscia MD 10/17/2015, 10:14 AM

## 2015-10-18 LAB — TSH: TSH: 3.473 u[IU]/mL (ref 0.350–4.500)

## 2015-10-18 NOTE — Progress Notes (Signed)
Adult Psychoeducational Group Note  Date:  10/18/2015 Time:  8:54 PM  Group Topic/Focus:  Wrap-Up Group:   The focus of this group is to help patients review their daily goal of treatment and discuss progress on daily workbooks.  Participation Level:  Active  Participation Quality:  Appropriate  Affect:  Appropriate  Cognitive:  Alert  Insight: Appropriate  Engagement in Group:  Engaged  Modes of Intervention:  Discussion  Additional Comments:  Pt stated that today was a good day. He wants to continue to engage in exercise. He is also waiting for a call from a friend.   Kaleen OdeaCOOKE, Tametra Ahart R 10/18/2015, 8:54 PM

## 2015-10-18 NOTE — BHH Group Notes (Signed)
BHH Group Notes:  (Counselor/Nursing/MHT/Case Management/Adjunct)  10/18/2015 1:15PM  Type of Therapy:  Group Therapy  Participation Level:  Active  Participation Quality:  Appropriate  Affect:  Flat  Cognitive:  Oriented  Insight:  Improving  Engagement in Group:  Limited  Engagement in Therapy:  Limited  Modes of Intervention:  Discussion, Exploration and Socialization  Summary of Progress/Problems: The topic for group was balance in life.  Pt participated in the discussion about when their life was in balance and out of balance and how this feels.  Pt discussed ways to get back in balance and short term goals they can work on to get where they want to be.  States he is grateful to be here and get the help he needs.  Denies all symptoms.  Shared that he hopes he is able to get back to [place] so he can be with his family.  Stayed the entire time.  Engaged throughout.   Daryel Geraldorth, Stasia Somero B 10/18/2015 1:40 PM

## 2015-10-18 NOTE — BHH Group Notes (Signed)
[facility] Group Notes:  (Nursing/MHT/Case Management/Adjunct)  Date:  10/18/2015  Time:  10:18 AM  Type of Therapy:  Nurse Education  Participation Level:  Active  Participation Quality:  Appropriate, Attentive and Sharing  Affect:  Appropriate  Cognitive:  Alert and Appropriate  Insight:  Improving  Engagement in Group:  Developing/Improving, Engaged and Supportive  Modes of Intervention:  Discussion and Education  Summary of Progress/Problems:  Group topic was Leisure and Lifestyle changes.  Questionnaire ball activity.  Stephen [name] participated well.  He answered questions about his goal and he wants to not to focus on depression as much.  [name] 10/18/2015, 10:18 AM

## 2015-10-18 NOTE — Progress Notes (Signed)
D:Pt up in mileu very pleasant calm and cooperative.  Denies SI/HI/AVH.  Prn given  for right arm pain . Please see MAR.  Interrupter with pt for communication of his needs. A: Emotional support provided.  Q 15 minute safety checks continued. Patient encouraged to interact with peers and attend groups.  Scheduled medications administered per MD orders. R:  Patient is safe on the unit.  Patient taking administered medications.  Patient visible in the milieu and interacting with peers.

## 2015-10-18 NOTE — Progress Notes (Signed)
DAR Notes: Stephen [name] has been visible on the unit.  He is alert and oriented X 3.  He denies any SI/HI or A/V hallucinations.  He has been attending groups.  He interacts with staff and interpreter.  He is wearing brace on his right ankle for support.  He has been noted walking up and down the hall and he reports that this is for exercise.  He reported pain in his wrist 2/10 but declined to take medications for pain.  He completed his self inventory and reports that his depression is 2/10 and hopelessness and anxiety are 0/10. He states that his goal for today is "mental and physical improvement of condition."  Encouraged continued participation in group and unit activities.  Q 15 minute checks maintained for safety.  We will continue to monitor the progress towards his goals.

## 2015-10-18 NOTE — Plan of Care (Signed)
Problem: Consults Goal: Psychosis Patient Education See Patient Education Module for education specifics.  Outcome: Progressing Denies SI/HI/AVH.

## 2015-10-18 NOTE — Progress Notes (Signed)
[facility] MD Progress Note  10/18/2015 1:46 PM Stephen [name]  MRN:  [id number] Subjective: Patient states " I am better. I feel the medications are working."  Objective:Patient is a 29 year old Hispanic male from [place], is employed , lives with a cousin in [place], who presented to [facility] S/P self inflicted wound to forearm - with a chain saw.  Patient seen today and chart reviewed.Discussed patient with treatment team. Pt is spanish speaking and spanish Interpreter was made use of for the evaluation. Patient today seen as less depressed than on admission. Pt continues to have some discomfort from his self inflicted wrist wound - dressing remains clean. Pt denies any psychosis, reports paranoia as improving. Pt is not seen as ruminating on his psychosocial stressors , but is seen as more interactive on the unit. Pt encouraged to attend groups and stay compliant on his medications. Pt denies any ADRs of medications.    Principal Problem: Major depressive disorder, recurrent episode, severe, with psychosis (HCC) Diagnosis:   Patient Active Problem List   Diagnosis Date Noted  . Major depressive disorder, recurrent episode, severe, with psychosis (HCC) [F33.3] 10/17/2015  . Tobacco use disorder [F17.200] 10/17/2015  . Cocaine use disorder, moderate, in early remission [F14.90] 10/17/2015  . Alcohol use disorder, mild, abuse [F10.10] 10/17/2015  . Laceration of wrist with complication [S61.519A] 10/06/2015   Total Time spent with patient: 30 minutes  Past Psychiatric History:Patient report prior admission to hospital at [place]- was started on medications - does not know the name. Pt reports several suicide attempts in the past - cutting wrist , jumping of a tree - few months ago- required knee surgery. Pt is not compliant on medications  Past Medical History:  Past Medical History  Diagnosis Date  . Hearing voices     Pt does not know exactly what it is but he does hear voices    Past  Surgical History  Procedure Laterality Date  . Fracture surgery Right 2016    right lower leg - August   . Back surgery  August   . I&d extremity Right 10/05/2015    Procedure: IRRIGATION AND DEBRIDEMENT RIGHT WRIST; REPAIR OF MULTIPLE FLEXOR TENDONS; MEDIAL NERVE NEUROLYSIS;  Surgeon: Dominica Severin, MD;  Location: [facility] OR;  Service: Orthopedics;  Laterality: Right;   Family History:  Family History  Problem Relation Age of Onset  . Alcoholism Father    Family Psychiatric  History: Pt reports his father is an alcoholic. Pt denies any hx of mental illness, suicide in family.  Social History: Pt reports he is single , denies having any children. Pt currently lives with a cousin in [place]. Pt reports he used to work in [employer]. Pt reports hx of being in jail for DWI - few years ago.  History  Alcohol Use No     History  Drug Use  . Yes  . Special: Cocaine, Marijuana    Comment: using cocainwe and thc.  Stopped 4 months ago    Social History   Social History  . Marital Status: Single    Spouse Name: N/A  . Number of Children: N/A  . Years of Education: N/A   Social History Main Topics  . Smoking status: Former Smoker -- 0.25 packs/day for 5 years    Types: Cigarettes  . Smokeless tobacco: Never Used  . Alcohol Use: No  . Drug Use: Yes    Special: Cocaine, Marijuana     Comment: using cocainwe and thc.  Stopped 4  months ago  . Sexual Activity: Yes    Birth Control/ Protection: None   Other Topics Concern  . None   Social History Narrative   Additional Social History:    History of alcohol / drug use?: Yes Name of Substance 1: etoh 1 - Age of First Use: 16 1 - Amount (size/oz): 40oz daily 1 - Frequency: daily 1 - Duration: many years 1 - Last Use / Amount: 4 months ago Name of Substance 2: cocaine 2 - Age of First Use: 22 2 - Amount (size/oz): unknown 2 - Frequency: unknown 2 - Duration: 2 years 2 - Last Use / Amount: 4 months ago Name of Substance 3: THC 3 -  Age of First Use: 16 3 - Amount (size/oz): unknown 3 - Frequency: several times a week 3 - Duration: years 3 - Last Use / Amount: 4 months              Sleep: Fair  Appetite:  Fair  Current Medications: Current Facility-Administered Medications  Medication Dose Route Frequency Provider Last Rate Last Dose  . acetaminophen (TYLENOL) tablet 650 mg  650 mg Oral Q6H PRN Kerry Hough, PA-C      . alum & mag hydroxide-simeth (MAALOX/MYLANTA) 200-200-20 MG/5ML suspension 30 mL  30 mL Oral Q4H PRN Kerry Hough, PA-C      . benztropine (COGENTIN) tablet 0.5 mg  0.5 mg Oral QHS Jomarie Longs, MD   0.5 mg at 10/17/15 2134  . FLUoxetine (PROZAC) capsule 20 mg  20 mg Oral Daily Kynsie Falkner, MD   20 mg at 10/18/15 0802  . haloperidol (HALDOL) tablet 5 mg  5 mg Oral QHS Jomarie Longs, MD   5 mg at 10/17/15 2134  . hydrOXYzine (ATARAX/VISTARIL) tablet 25 mg  25 mg Oral Q6H PRN Kerry Hough, PA-C   25 mg at 10/16/15 2148  . ibuprofen (ADVIL,MOTRIN) tablet 600 mg  600 mg Oral Q6H PRN Kerry Hough, PA-C   600 mg at 10/17/15 2005  . risperiDONE (RISPERDAL M-TABS) disintegrating tablet 2 mg  2 mg Oral Q8H PRN Kerry Hough, PA-C       And  . LORazepam (ATIVAN) tablet 1 mg  1 mg Oral PRN Kerry Hough, PA-C       And  . ziprasidone (GEODON) injection 20 mg  20 mg Intramuscular PRN Kerry Hough, PA-C      . magnesium hydroxide (MILK OF MAGNESIA) suspension 30 mL  30 mL Oral Daily PRN Kerry Hough, PA-C      . traZODone (DESYREL) tablet 50 mg  50 mg Oral QHS,MR X 1 Kerry Hough, PA-C   50 mg at 10/17/15 2135    Lab Results:  Results for orders placed or performed during the hospital encounter of 10/16/15 (from the past 48 hour(s))  Urine rapid drug screen (hosp performed)     Status: None   Collection Time: 10/17/15  6:48 PM  Result Value Ref Range   Opiates NONE DETECTED NONE DETECTED   Cocaine NONE DETECTED NONE DETECTED   Benzodiazepines NONE DETECTED NONE DETECTED    Amphetamines NONE DETECTED NONE DETECTED   Tetrahydrocannabinol NONE DETECTED NONE DETECTED   Barbiturates NONE DETECTED NONE DETECTED    Comment:        DRUG SCREEN FOR MEDICAL PURPOSES ONLY.  IF CONFIRMATION IS NEEDED FOR ANY PURPOSE, NOTIFY LAB WITHIN 5 DAYS.        LOWEST DETECTABLE LIMITS FOR URINE DRUG SCREEN  Drug Class       Cutoff (ng/mL) Amphetamine      1000 Barbiturate      200 Benzodiazepine   200 Tricyclics       300 Opiates          300 Cocaine          300 THC              50 [facility]   TSH     Status: None   Collection Time: 10/18/15  6:30 AM  Result Value Ref Range   TSH 3.473 0.350 - 4.500 uIU/mL    Comment: [facility]    Physical Findings: AIMS: Facial and Oral Movements Muscles of Facial Expression: None, normal Lips and Perioral Area: None, normal Jaw: None, normal Tongue: None, normal,Extremity Movements Upper (arms, wrists, hands, fingers): None, normal Lower (legs, knees, ankles, toes): None, normal, Trunk Movements Neck, shoulders, hips: None, normal, Overall Severity Severity of abnormal movements (highest score from questions above): None, normal Incapacitation due to abnormal movements: None, normal Patient's awareness of abnormal movements (rate only patient's report): No Awareness, Dental Status Current problems with teeth and/or dentures?: No Does patient usually wear dentures?: No  CIWA:    COWS:     Musculoskeletal: Strength & Muscle Tone: within normal limits Gait & Station: normal Patient leans: N/A  Psychiatric Specialty Exam: Review of Systems  Psychiatric/Behavioral: Positive for depression. The patient is nervous/anxious.   All other systems reviewed and are negative.   Blood pressure 106/65, pulse 83, temperature 98 F (36.7 C), temperature source Oral, resp. rate 18, height 5\' 3"  (1.6 m), weight 69.854 kg (154 lb).Body mass index is 27.29 kg/(m^2).   General Appearance: Casual  Eye Contact::  Fair  Speech:  Clear and Coherent  Volume:  Normal  Mood:  Anxious and Depressed  Affect:  Appropriate  Thought Process:  Goal Directed  Orientation:  Full (Time, Place, and Person)  Thought Content:  Delusions, Paranoid Ideation and Rumination  Suicidal Thoughts:  denies , but is S/P severe suicide attempt - cut his wrist with a saw.  Homicidal Thoughts:  No  Memory:  Immediate;   Fair Recent;   Fair Remote;   Fair  Judgement:  Impaired  Insight:  Shallow  Psychomotor Activity:  Normal  Concentration:  Poor  Recall:  F[employer]Fair  Fund of Knowledge:Fair  Language: Fair  Akathisia:  No  Handed:  Right  AIMS (if indicated):     Assets:  Desire for Improvement  ADL's:  Intact  Cognition: WNL  Sleep:      Treatment Plan Summary: Patient presented after a severe suicide attempt - this being his second suicide attempt in the past few months. Pt had cut his wrist with a saw- and required surgery and repair . Pt on the unit - seems to be making progress. Pt has been tolerating medications well- will continue treatment.  Daily contact with patient to assess and evaluate symptoms and progress in treatment and Medication management Will continue Prozac 20 mg po daily for affective sx. Will continue Haldol 5 mg po qhs for psychosis. Will continue  Cogentin 0.5 mg po qhs for EPS. Will continue Trazodone 50 mg po qhs for sleep. Will make available PRN medications as per agitation protocol.  Will continue to monitor vitals ,medication compliance and treatment side effects while patient is here.  Will monitor for medical issues as well as call consult as  needed.  Will continue dressing as per recommendation from Orthopedics consult - as per recommendations - Will consider contacting Gramig MD - cell phone [phone]- IF NEEDED.   Reviewed labs UDS-wnl  EKG- qtc - wnl , TSH- wnl , pending PL . CSW will start working on disposition. Pt to ve  referred for CBT once stable for discharge. Patient to participate in therapeutic milieu .   Meral Geissinger MD 10/18/2015, 1:46 PM

## 2015-10-19 DIAGNOSIS — E221 Hyperprolactinemia: Secondary | ICD-10-CM | POA: Clinically undetermined

## 2015-10-19 LAB — PROLACTIN: PROLACTIN: 65.2 ng/mL — AB (ref 4.0–15.2)

## 2015-10-19 MED ORDER — FLUOXETINE HCL 20 MG PO CAPS
40.0000 mg | ORAL_CAPSULE | Freq: Every day | ORAL | Status: DC
  Administered 2015-10-20 – 2015-10-22 (×3): 40 mg via ORAL
  Filled 2015-10-19 (×2): qty 2
  Filled 2015-10-19: qty 60
  Filled 2015-10-19 (×2): qty 2

## 2015-10-19 NOTE — Progress Notes (Signed)
Recreation Therapy Notes  12.08.2016 LRT met with patient to determine leisure and recreation interest patient can use as coping skills and engage in post d/c. Patient reports interest in working out, specifically lifting weights and running. Patient additionally reported interest in reading. LRT educated patient in importance of using exercise as a coping skill, explaining not only the physical benefits, but the mental as well. Patient receptive to information. Patient reports no income at the moment, as he is out of work. LRT agreed to provide patient information on parks and green ways in [place], patient receptive. Additionally patient educated on [employer] and their services, specifically the sliding scale application, which provides low income individuals with access to the [facility]. Patient receptive.   Laureen Ochs Valena Ivanov, LRT/CTRS   Lane Hacker 10/19/2015 2:31 PM

## 2015-10-19 NOTE — [facility] Group Notes (Signed)
[facility] LCSW Group Therapy   10/19/2015 1:39 PM  Type of Therapy: Group Therapy  Participation Level:  Active  Participation Quality:  Attentive  Affect:  Flat  Cognitive:  Oriented  Insight:  Limited  Engagement in Therapy:  Engaged  Modes of Intervention:  Discussion and Socialization  Summary of Progress/Problems: Chaplain was here to lead a group on themes of hope and/or courage. "Hope is feeling better and hoping for a brighter future". Pt explains that he feels fortunate to have this opportunity to be in the hospital and get help. Pt also spoke about his family being a part of his hope and that it has been difficult for him to be away from them for so long. Pt also spoke about hoping to return back to work and hoping to be able to stay in one place for a while instead of moving around all the time.  Stephen JordanLynn B [name] 10/19/2015 1:39 PM

## 2015-10-19 NOTE — Progress Notes (Signed)
Patient did not attend wrap up group. 

## 2015-10-19 NOTE — Progress Notes (Signed)
D: Writer spoke with patient with interpreter present at the beginning of the shift. Pt was concerned about not having placement prior to discharge. Pt informed that the worst case scenario may be a referral to the shelter. However, pt was informed that his disposition is still being processed. Pt was referred to speak with counselor about his concerns.Pt denies any SI/HI/AVH. Pt informed of his qhs medications. Pt reports an overall improvement in is physical well-being. A: Writer administered scheduled medications to pt, per MD orders. Continued support and availability as needed was extended to this pt. Staff continues to monitor pt with q7315min checks.  R: No adverse drug reactions noted. Pt receptive to treatment. Pt remains safe at this time.

## 2015-10-19 NOTE — [facility] Group Notes (Signed)
[facility]   10/19/2015 10:50 AM  Participation Quality:  Engaged  Mood/Affect:  Appropriate  Depression Rating:    Anxiety Rating:    Thoughts of Suicide:  No Will you contract for safety?   NA  Current AVH:  denies  Plan for Discharge/Comments:  Mood good.  Wearing do-rag on his head today.  States he is feeling good other than a burning feeling in his throat.  Encouraged to speak to the Dr about that.  States he has not spoken with his friend about staying with him since admission, but he will continue to try.  Transportation Means:   Supports:  Daryel GeraldNorth, Norlene Lanes B

## 2015-10-19 NOTE — Progress Notes (Signed)
[facility] MD Progress Note  10/19/2015 12:26 PM Stephen [name]  MRN:  [id number] Subjective: Patient states " I am better. I am concerned about my arm though."   Objective:Patient is a 29 year old Hispanic male from [place], is employed , lives with a cousin in [place], who presented to [facility] S/P self inflicted wound to forearm - with a saw.  Patient seen today and chart reviewed.Discussed patient with treatment team. Pt is spanish speaking and spanish Interpreter was made use of for the evaluation. Patient today seen as less depressed , however seems to be anxious , mostly about his forearm. Provided reassurance- discussed with pt - that his dressing will be changed today - as per orthopedic team recommendations. Pt is not seen as ruminating on his psychosocial stressors , but is seen as more interactive on the unit. Pt encouraged to attend groups and stay compliant on his medications. Pt denies any ADRs of medications.    Principal Problem: Major depressive disorder, recurrent episode, severe, with psychosis (HCC) Diagnosis:   Patient Active Problem List   Diagnosis Date Noted  . Hyperprolactinemia (HCC) [E22.1] 10/19/2015  . Major depressive disorder, recurrent episode, severe, with psychosis (HCC) [F33.3] 10/17/2015  . Tobacco use disorder [F17.200] 10/17/2015  . Cocaine use disorder, moderate, in early remission [F14.90] 10/17/2015  . Alcohol use disorder, mild, abuse [F10.10] 10/17/2015  . Laceration of wrist with complication [S61.519A] 10/06/2015   Total Time spent with patient: 30 minutes  Past Psychiatric History:Patient report prior admission to hospital at [facility]- was started on medications - does not know the name. Pt reports several suicide attempts in the past - cutting wrist , jumping of a tree - few months ago- required knee surgery. Pt is not compliant on medications  Past Medical History:  Past Medical History  Diagnosis Date  . Hearing voices     Pt does not know  exactly what it is but he does hear voices    Past Surgical History  Procedure Laterality Date  . Fracture surgery Right 2016    right lower leg - August   . Back surgery  August   . I&d extremity Right 10/05/2015    Procedure: IRRIGATION AND DEBRIDEMENT RIGHT WRIST; REPAIR OF MULTIPLE FLEXOR TENDONS; MEDIAL NERVE NEUROLYSIS;  Surgeon: Dominica Severin, MD;  Location: [facility] OR;  Service: Orthopedics;  Laterality: Right;   Family History:  Family History  Problem Relation Age of Onset  . Alcoholism Father    Family Psychiatric  History: Pt reports his father is an alcoholic. Pt denies any hx of mental illness, suicide in family.  Social History: Pt reports he is single , denies having any children. Pt currently lives with a cousin in [place]. Pt reports he used to work in [employer]. Pt reports hx of being in jail for DWI - few years ago.  History  Alcohol Use No     History  Drug Use  . Yes  . Special: Cocaine, Marijuana    Comment: using cocainwe and thc.  Stopped 4 months ago    Social History   Social History  . Marital Status: Single    Spouse Name: N/A  . Number of Children: N/A  . Years of Education: N/A   Social History Main Topics  . Smoking status: Former Smoker -- 0.25 packs/day for 5 years    Types: Cigarettes  . Smokeless tobacco: Never Used  . Alcohol Use: No  . Drug Use: Yes    Special: Cocaine, Marijuana  Comment: using cocainwe and thc.  Stopped 4 months ago  . Sexual Activity: Yes    Birth Control/ Protection: None   Other Topics Concern  . None   Social History Narrative   Additional Social History:    History of alcohol / drug use?: Yes Name of Substance 1: etoh 1 - Age of First Use: 16 1 - Amount (size/oz): 40oz daily 1 - Frequency: daily 1 - Duration: many years 1 - Last Use / Amount: 4 months ago Name of Substance 2: cocaine 2 - Age of First Use: 22 2 - Amount (size/oz): unknown 2 - Frequency: unknown 2 - Duration: 2 years 2 - Last Use /  Amount: 4 months ago Name of Substance 3: THC 3 - Age of First Use: 16 3 - Amount (size/oz): unknown 3 - Frequency: several times a week 3 - Duration: years 3 - Last Use / Amount: 4 months              Sleep: Fair  Appetite:  Fair  Current Medications: Current Facility-Administered Medications  Medication Dose Route Frequency Provider Last Rate Last Dose  . acetaminophen (TYLENOL) tablet 650 mg  650 mg Oral Q6H PRN Kerry HoughSpencer E Simon, PA-C      . alum & mag hydroxide-simeth (MAALOX/MYLANTA) 200-200-20 MG/5ML suspension 30 mL  30 mL Oral Q4H PRN Kerry HoughSpencer E Simon, PA-C      . benztropine (COGENTIN) tablet 0.5 mg  0.5 mg Oral QHS Jomarie LongsSaramma Meloni Hinz, MD   0.5 mg at 10/18/15 2127  . [START ON 10/20/2015] FLUoxetine (PROZAC) capsule 40 mg  40 mg Oral Daily Kyeisha Janowicz, MD      . haloperidol (HALDOL) tablet 5 mg  5 mg Oral QHS Eisley Barber, MD   5 mg at 10/18/15 2127  . hydrOXYzine (ATARAX/VISTARIL) tablet 25 mg  25 mg Oral Q6H PRN Kerry HoughSpencer E Simon, PA-C   25 mg at 10/16/15 2148  . ibuprofen (ADVIL,MOTRIN) tablet 600 mg  600 mg Oral Q6H PRN Kerry HoughSpencer E Simon, PA-C   600 mg at 10/18/15 1411  . risperiDONE (RISPERDAL M-TABS) disintegrating tablet 2 mg  2 mg Oral Q8H PRN Kerry HoughSpencer E Simon, PA-C       And  . LORazepam (ATIVAN) tablet 1 mg  1 mg Oral PRN Kerry HoughSpencer E Simon, PA-C       And  . ziprasidone (GEODON) injection 20 mg  20 mg Intramuscular PRN Kerry HoughSpencer E Simon, PA-C      . magnesium hydroxide (MILK OF MAGNESIA) suspension 30 mL  30 mL Oral Daily PRN Kerry HoughSpencer E Simon, PA-C      . traZODone (DESYREL) tablet 50 mg  50 mg Oral QHS,MR X 1 Kerry HoughSpencer E Simon, PA-C   50 mg at 10/18/15 2128    Lab Results:  Results for orders placed or performed during the hospital encounter of 10/16/15 (from the past 48 hour(s))  Urine rapid drug screen (hosp performed)     Status: None   Collection Time: 10/17/15  6:48 PM  Result Value Ref Range   Opiates NONE DETECTED NONE DETECTED   Cocaine NONE DETECTED NONE  DETECTED   Benzodiazepines NONE DETECTED NONE DETECTED   Amphetamines NONE DETECTED NONE DETECTED   Tetrahydrocannabinol NONE DETECTED NONE DETECTED   Barbiturates NONE DETECTED NONE DETECTED    Comment:        DRUG SCREEN FOR MEDICAL PURPOSES ONLY.  IF CONFIRMATION IS NEEDED FOR ANY PURPOSE, NOTIFY LAB WITHIN 5 DAYS.  LOWEST DETECTABLE LIMITS FOR URINE DRUG SCREEN Drug Class       Cutoff (ng/mL) Amphetamine      1000 Barbiturate      200 Benzodiazepine   200 Tricyclics       300 Opiates          300 Cocaine          300 THC              50 Performed at [facility]   TSH     Status: None   Collection Time: 10/18/15  6:30 AM  Result Value Ref Range   TSH 3.473 0.350 - 4.500 uIU/mL    Comment: Performed at [facility]  Prolactin     Status: Abnormal   Collection Time: 10/18/15  6:30 AM  Result Value Ref Range   Prolactin 65.2 (H) 4.0 - 15.2 ng/mL    Comment: (NOTE) Performed At: [facility] [address] [place], [place] [id number] Mila Homer MD WU:[id number] Performed at [facility]     Physical Findings: AIMS: Facial and Oral Movements Muscles of Facial Expression: None, normal Lips and Perioral Area: None, normal Jaw: None, normal Tongue: None, normal,Extremity Movements Upper (arms, wrists, hands, fingers): None, normal Lower (legs, knees, ankles, toes): None, normal, Trunk Movements Neck, shoulders, hips: None, normal, Overall Severity Severity of abnormal movements (highest score from questions above): None, normal Incapacitation due to abnormal movements: None, normal Patient's awareness of abnormal movements (rate only patient's report): No Awareness, Dental Status Current problems with teeth and/or dentures?: No Does patient usually wear dentures?: No  CIWA:    COWS:     Musculoskeletal: Strength & Muscle Tone: within normal limits Gait & Station: normal Patient leans:  N/A  Psychiatric Specialty Exam: Review of Systems  Psychiatric/Behavioral: Positive for depression. The patient is nervous/anxious.   All other systems reviewed and are negative.   Blood pressure 104/64, pulse 89, temperature 98.4 F (36.9 C), temperature source Oral, resp. rate 16, height  (1.6 m), weight 69.854 kg (154 lb).Body mass index is 27.29 kg/(m^2).  General Appearance: Casual  Eye Contact::  Fair  Speech:  Clear and Coherent  Volume:  Normal  Mood:  Anxious and Depressed  Affect:  Appropriate  Thought Process:  Goal Directed  Orientation:  Full (Time, Place, and Person)  Thought Content:  Delusions, Paranoid Ideation and Rumination- improving  Suicidal Thoughts:  denies , but is S/P severe suicide attempt - cut his wrist with a saw.  Homicidal Thoughts:  No  Memory:  Immediate;   Fair Recent;   Fair Remote;   Fair  Judgement:  Impaired  Insight:  Shallow  Psychomotor Activity:  Normal  Concentration:  Poor  Recall:  [employer] of Knowledge:Fair  Language: Fair  Akathisia:  No  Handed:  Right  AIMS (if indicated):     Assets:  Desire for Improvement  ADL's:  Intact  Cognition: WNL  Sleep:  Number of Hours: 6.5   Treatment Plan Summary: Patient presented after a severe suicide attempt - this being his second suicide attempt in the past few months. Pt had cut his wrist with a saw- and required surgery and repair . Pt appears less depressed , is anxious about his forearm .  Pt has been tolerating medications well- will continue treatment.  Daily contact with patient to assess and evaluate symptoms and progress in treatment and Medication management Will increase Prozac to 40  mg po daily for affective sx. Will continue Haldol 5 mg po qhs for psychosis. Will continue  Cogentin 0.5 mg po qhs for EPS. Will continue Trazodone 50 mg po qhs for sleep. Will make available PRN medications as per agitation protocol.  Will continue to monitor vitals ,medication  compliance and treatment side effects while patient is here.  Will monitor for medical issues as well as call consult as needed.  Will continue dressing as per recommendation from Orthopedics consult - as per recommendations - Will consider contacting Gramig MD - cell phone [phone]- IF NEEDED.   Reviewed labs- PL level high - likely 2/2 medications - will advise pt to follow up with out patient provider . CSW will start working on disposition. Pt to be referred for CBT once stable for discharge. Patient to participate in therapeutic milieu .   Zaul Hubers MD 10/19/2015, 12:26 PM

## 2015-10-19 NOTE — Progress Notes (Signed)
Dressing changed wound was clean dry and well approximated. Swelling noted in the palm of his hand no heat no redness.  Patient had good dorsiflexion. Patient stated that he was depressed from being away from his family in [place] for so long. Patient stated the desire to contact immigration to be deported back to [place].   Assess patient for safety, offer medications as prescribed engaged patient in 1:1 therapeutic talks.   Patient was able to contract for safety.

## 2015-10-19 NOTE — Progress Notes (Signed)
Recreation Therapy Notes  12.09.2016 @ approximately 10:10am. LRT returned to provide patient with information previously promised. LRT provided information on the [address] and parks in the [place] area. Additionally financial assistance application for the [employer] was provided to patient. Patient receptive and thankful for resources provided.   Marykay Lexenise L Yolonda Purtle, LRT/CTRS   Shaney Deckman L 10/19/2015 2:35 PM

## 2015-10-20 MED ORDER — NICOTINE POLACRILEX 2 MG MT GUM
CHEWING_GUM | OROMUCOSAL | Status: AC
  Filled 2015-10-20: qty 1

## 2015-10-20 MED ORDER — NICOTINE POLACRILEX 2 MG MT GUM
2.0000 mg | CHEWING_GUM | OROMUCOSAL | Status: DC | PRN
  Administered 2015-10-20 (×2): 2 mg via ORAL
  Filled 2015-10-20: qty 1

## 2015-10-20 NOTE — Progress Notes (Signed)
Adult Psychoeducational Group Note  Date:  10/20/2015 Time:  8:35 PM  Group Topic/Focus:  Wrap-Up Group:   The focus of this group is to help patients review their daily goal of treatment and discuss progress on daily workbooks.  Participation Level:  Active  Participation Quality:  Appropriate  Affect:  Appropriate  Cognitive:  Appropriate  Insight: Appropriate  Engagement in Group:  Engaged  Modes of Intervention:  Discussion  Additional Comments: The patient expressed that he attend group.The patient also said that he rates today a 8 which is a good day.  Octavio Mannshigpen, Shelle Galdamez Lee 10/20/2015, 8:35 PM

## 2015-10-20 NOTE — Progress Notes (Addendum)
D: Pt presents flat in affect and depressed in mood. Pt is visible within the milieu but with minimal interactions with others. Pt denied any SI/HI/AVH. Pt is compliant with his POC. Pt is wishing for his friend Terrial Rhodes(Eldai) to receive information about his pending discharge and his current  progress. He was also interested in having his visiting friend receive information about the care he received at a previous facility. Pt's friend was concerned about the patient being discharged to a facility where he was not under 24/7 monitoring. Pt was informed that the treatment team is aware of his condition and would make the appropriate discharge plans. Pt was encouraged to speak with the SW in regards to his discharge and the release of such information to his friend. Pt was also [facility] speak on the behalf of the previous care that he received at the non-affiliated [facility] facility (PT[facility]). Pt is still attempting to reach his cousin (his only local family member). Pt denies any SI/HI/AVH. Pt denies any physical pain.  A: Writer administered scheduled medications to pt, per MD orders. Continued support and availability as needed was extended to this pt. Staff continues to monitor pt with q3515min checks.  R: No adverse drug reactions noted. Pt receptive to treatment. Pt remains safe at this time.

## 2015-10-20 NOTE — [facility] Group Notes (Signed)
[facility] Group Notes:  (Clinical Social Work)  10/20/2015  11:15-12:00PM  Summary of Progress/Problems:   Today's process group involved patients discussing their feelings related to being hospitalized, as well as how they want to feel in order to be ready to discharge.  It was agreed in general by the group that it would be preferable to avoid future hospitalizations, and there was a discussion about healthy coping techniques that will help with that goal and unhealthy coping techniques that will hinder it. The patient expressed hisprimary feeling about being hospitalized is "fortunate for the attention" and said he is in the hospital for physical and mental impairments.  He paid attention, listened through his interpreter.    Type of Therapy:  Group Therapy - Process  Participation Level:  Active  Participation Quality:  Attentive  Affect:  Blunted  Cognitive:  Appropriate  Insight:  Improving  Engagement in Therapy:  Improving  Modes of Intervention:  Exploration, Discussion  Stephen MantleMareida Grossman-Orr, LCSW 10/20/2015, 12:53 PM

## 2015-10-20 NOTE — Progress Notes (Signed)
Removed dressing that was falling off.   Incision site healing well.  No drainage noted.  Pt states that his surgeon wanted him to leave the dressing off after it was removed on Friday.  Cleaned dried blood off around the incision site.  Pt declined reapplication of dressing.  Encouraged him to let the doctor see it tomorrow to make sure that we don't need to do anything different.  We will continue to monitor the progress towards his goals.

## 2015-10-20 NOTE — Progress Notes (Signed)
D) Pt has been seclusive to his room. Did come out for his medications and took them without issue. Affect is flat and mood depressed. Pt rates his depression at a 0, hopelessness at a 0 and anxiety at a 0. Understands some english and is pleasant with interaction. Denies SI and HI. A) Pt given support and reassurance along with praise. Encouragement provided R) Denies Si and HI

## 2015-10-20 NOTE — Progress Notes (Signed)
[facility]  10/20/2015 10:57 AM Parker Felipa FurnaceGarcia  MRN:  [id number] Subjective: Patient states " I am fine.  "   Objective:Patient is a 29 year old Hispanic male from [place], is employed , lives with a cousin in [place], who [facility] S/P self inflicted wound to forearm - with a saw.  Patient seen today with translator and chart reviewed.  Patient endorse he has been depressed because his family is in [place] and he has not seen in a while.  He admitted he missed them a lot.  He admitted noncompliant with medication from his last discharge .  He admitted he felt that he does not need it but then he became very depressed again.  He endorsed his arm is getting better but he still have some time pain.  He feels medicine working but he does not want to talk about his depression in detail.  He remains very anxious and mostly concerned about his family .  Patient currently not seeing any outpatient psychiatrist.  Burgess EstelleYesterday his Prozac was increased and he is tolerating well.  He has limited participation in the groups.  He was noticed isolated and withdrawn.   Principal Problem: Major depressive disorder, recurrent episode, severe, with psychosis (HCC) Diagnosis:   Patient Active Problem List   Diagnosis Date Noted  . Hyperprolactinemia (HCC) [E22.1] 10/19/2015  . Major depressive disorder, recurrent episode, severe, with psychosis (HCC) [F33.3] 10/17/2015  . Tobacco use disorder [F17.200] 10/17/2015  . Cocaine use disorder, moderate, in early remission [F14.90] 10/17/2015  . Alcohol use disorder, mild, abuse [F10.10] 10/17/2015  . Laceration of wrist with complication [S61.519A] 10/06/2015   Total Time spent with patient: 30 minutes  Past Psychiatric History:Patient report prior admission to hospital at [facility]- was started on medications - does not know the name. Pt reports several suicide attempts in the past - cutting wrist , jumping of a tree - few months ago- required knee  surgery. Pt is not compliant on medications  Past Medical History:  Past Medical History  Diagnosis Date  . Hearing voices     Pt does not know exactly what it is but he does hear voices    Past Surgical History  Procedure Laterality Date  . Fracture surgery Right 2016    right lower leg - August   . Back surgery  August   . I&d extremity Right 10/05/2015    Procedure: IRRIGATION AND DEBRIDEMENT RIGHT WRIST; REPAIR OF MULTIPLE FLEXOR TENDONS; MEDIAL NERVE NEUROLYSIS;  Surgeon: Dominica SeverinWilliam Gramig, MD;  Location: [facility] OR;  Service: Orthopedics;  Laterality: Right;   Family History:  Family History  Problem Relation Age of Onset  . Alcoholism Father    Family Psychiatric  History: Pt reports his father is an alcoholic. Pt denies any hx of mental illness, suicide in family.  Social History: Pt reports he is single , denies having any children. Pt currently lives with a cousin in [place]. Pt reports he used to work in [employer]. Pt reports hx of being in jail for DWI - few years ago.  History  Alcohol Use No     History  Drug Use  . Yes  . Special: Cocaine, Marijuana    Comment: using cocainwe and thc.  Stopped 4 months ago    Social History   Social History  . Marital Status: Single    Spouse Name: N/A  . Number of Children: N/A  . Years of Education: N/A   Social History Main Topics  .  Smoking status: Former Smoker -- 0.25 packs/day for 5 years    Types: Cigarettes  . Smokeless tobacco: Never Used  . Alcohol Use: No  . Drug Use: Yes    Special: Cocaine, Marijuana     Comment: using cocainwe and thc.  Stopped 4 months ago  . Sexual Activity: Yes    Birth Control/ Protection: None   Other Topics Concern  . None   Social History Narrative   Additional Social History:    History of alcohol / drug use?: Yes Name of Substance 1: etoh 1 - Age of First Use: 16 1 - Amount (size/oz): 40oz daily 1 - Frequency: daily 1 - Duration: many years 1 - Last Use / Amount: 4 months  ago Name of Substance 2: cocaine 2 - Age of First Use: 22 2 - Amount (size/oz): unknown 2 - Frequency: unknown 2 - Duration: 2 years 2 - Last Use / Amount: 4 months ago Name of Substance 3: THC 3 - Age of First Use: 16 3 - Amount (size/oz): unknown 3 - Frequency: several times a week 3 - Duration: years 3 - Last Use / Amount: 4 months              Sleep: Fair  Appetite:  Fair  Current Medications: Current Facility-Administered Medications  Medication Dose Route Frequency Provider Last Rate Last Dose  . acetaminophen (TYLENOL) tablet 650 mg  650 mg Oral Q6H PRN Kerry Hough, PA-C      . alum & mag hydroxide-simeth (MAALOX/MYLANTA) 200-200-20 MG/5ML suspension 30 mL  30 mL Oral Q4H PRN Kerry Hough, PA-C      . benztropine (COGENTIN) tablet 0.5 mg  0.5 mg Oral QHS Jomarie Longs, MD   0.5 mg at 10/19/15 2127  . FLUoxetine (PROZAC) capsule 40 mg  40 mg Oral Daily Saramma Eappen, MD   40 mg at 10/20/15 0829  . haloperidol (HALDOL) tablet 5 mg  5 mg Oral QHS Saramma Eappen, MD   5 mg at 10/19/15 2127  . hydrOXYzine (ATARAX/VISTARIL) tablet 25 mg  25 mg Oral Q6H PRN Kerry Hough, PA-C   25 mg at 10/16/15 2148  . ibuprofen (ADVIL,MOTRIN) tablet 600 mg  600 mg Oral Q6H PRN Kerry Hough, PA-C   600 mg at 10/19/15 1633  . risperiDONE (RISPERDAL M-TABS) disintegrating tablet 2 mg  2 mg Oral Q8H PRN Kerry Hough, PA-C       And  . LORazepam (ATIVAN) tablet 1 mg  1 mg Oral PRN Kerry Hough, PA-C       And  . ziprasidone (GEODON) injection 20 mg  20 mg Intramuscular PRN Kerry Hough, PA-C      . magnesium hydroxide (MILK OF MAGNESIA) suspension 30 mL  30 mL Oral Daily PRN Kerry Hough, PA-C      . traZODone (DESYREL) tablet 50 mg  50 mg Oral QHS,MR X 1 Kerry Hough, PA-C   50 mg at 10/19/15 2223    Lab Results:  No results found for this or any previous visit (from the past 48 hour(s)).  Physical Findings: AIMS: Facial and Oral Movements Muscles of Facial  Expression: None, normal Lips and Perioral Area: None, normal Jaw: None, normal Tongue: None, normal,Extremity Movements Upper (arms, wrists, hands, fingers): None, normal Lower (legs, knees, ankles, toes): None, normal, Trunk Movements Neck, shoulders, hips: None, normal, Overall Severity Severity of abnormal movements (highest score from questions above): None, normal Incapacitation due to abnormal movements:  None, normal Patient's awareness of abnormal movements (rate only patient's report): No Awareness, Dental Status Current problems with teeth and/or dentures?: No Does patient usually wear dentures?: No  CIWA:    COWS:     Musculoskeletal: Strength & Muscle Tone: within normal limits Gait & Station: normal Patient leans: N/A  Psychiatric Specialty Exam: Review of Systems  Psychiatric/Behavioral: Positive for depression. The patient is nervous/anxious.   All other systems reviewed and are negative.   Blood pressure 99/61, pulse 120, temperature 98.3 F (36.8 C), temperature source Oral, resp. rate 16, height  (1.6 m), weight 69.854 kg (154 lb).Body mass index is 27.29 kg/(m^2).  General Appearance: Casual  Eye Contact::  Fair  Speech:  Clear and Coherent  Volume:  Normal  Mood:  Anxious and Depressed  Affect:  Appropriate  Thought Process:  Goal Directed  Orientation:  Full (Time, Place, and Person)  Thought Content:  Rumination-  Suicidal Thoughts:  denies , but is S/P severe suicide attempt - cut his wrist with a saw.  Homicidal Thoughts:  No  Memory:  Immediate;   Fair Recent;   Fair Remote;   Fair  Judgement:  Impaired  Insight:  Shallow  Psychomotor Activity:  Normal  Concentration:  Poor  Recall:  [employer] of Knowledge:Fair  Language: Fair  Akathisia:  No  Handed:  Right  AIMS (if indicated):     Assets:  Desire for Improvement  ADL's:  Intact  Cognition: WNL  Sleep:  Number of Hours: 6.75   Treatment Plan Summary: Patient presented after a  severe suicide attempt - this being his second suicide attempt in the past few months. Pt had cut his wrist with a saw- and required surgery and repair .   Daily contact with patient to assess and evaluate symptoms and progress in treatment and Medication management Continue Prozac 40  mg po daily for affective sx. Will continue Haldol 5 mg po qhs for psychosis. Will continue  Cogentin 0.5 mg po qhs for EPS. Will continue Trazodone 50 mg po qhs for sleep. Will make available PRN medications as per agitation protocol.  Will continue to monitor vitals ,medication compliance and treatment side effects while patient is here.  Will monitor for medical issues as well as call consult as needed.  Will continue dressing as per recommendation from Orthopedics consult - as per recommendations - Will consider contacting Gramig MD - cell phone [phone]- IF NEEDED.   Reviewed labs- PL level high - likely 2/2 medications - will advise pt to follow up with out patient provider . CSW will start working on disposition. Pt to be referred for CBT once stable for discharge. Patient to participate in therapeutic milieu .   Malaka Ruffner T. MD 10/20/2015, 10:57 AM

## 2015-10-21 NOTE — Progress Notes (Signed)
Writer has observed patient up in the dayroom watching tv with his interpreter by his side. Writer spoke with him 1:1 and he reported tht his pain in his hand had decreased since receiving ibuprofen earlier. He was informed of his scheduled medications and is agreeable to taking them. He reports that he has spoken with his cousin on the phone and is aware he can use the phone daily. He attended group tonight and denies si/hi/a/v hallucinations. Support and encouragement given,safety maintained on unit with 15 min checks.

## 2015-10-21 NOTE — [facility] Group Notes (Signed)
[place] Group Notes:  (Nursing/MHT/Case Management/Adjunct)  Date:  10/21/2015  Time:  1000  Type of Therapy:  Nurse Education  /  Life Skills : The group is focused on helping patients identify unhealthy behaviors as well as identify healthy behaviors they can use to helap them get their needs met.  Participation Level:  Active  Participation Quality:  Appropriate  Affect:  Appropriate  Cognitive:  Alert  Insight:  Appropriate  Engagement in Group:  Engaged  Modes of Intervention:  Education  Summary of Progress/Problems:  Stephen [name] 10/21/2015, 1:15 PM

## 2015-10-21 NOTE — Plan of Care (Signed)
Problem: Diagnosis: Increased Risk For Suicide Attempt Goal: STG-Patient Will Attend All Groups On The Unit Outcome: Progressing Patient attended evening wrap up group and participated.     

## 2015-10-21 NOTE — Plan of Care (Signed)
Problem: Alteration in mood Goal: LTG-Patient reports reduction in suicidal thoughts (Patient reports reduction in suicidal thoughts and is able to verbalize a safety plan for whenever patient is feeling suicidal)  Outcome: Progressing Patient currently denies suicidal ideations.      

## 2015-10-21 NOTE — Progress Notes (Signed)
Writer has observed patient up in the dayroom and out in the hallway mostly this evening. He reports that he has been getting his exercise in by walking but the more he walks it causes his leg to hurt. He requested ibuprofen for his pain. He reports that once discharged he will be returning to work and will go to the gym for exercise. He is concerned that he has not met a male yet but says that he is going to love himself first. He denies si/hi/a/v hallucinations. He did not use the interpreter who was on the hall during our conversation and responded appropriately without any interpretation. Support given, safety maintained on unit with 15 min checks.

## 2015-10-21 NOTE — Clinical Social Work Note (Signed)
Clinical Social Work Note  At patient request, CSW called his cousin June LeapMiguel Setterlund [phone] to discuss his discharge plan.  He did not speak adequate English for a conversation to take place, although it seemed someone was speaking to him in the background, possibly interpreting.  CSW just informed [facility]uel, with him expressing understanding, that we would call when a discharge date is known.  He confirmed that [facility], a place to stay as well as a job.  Ambrose MantleMareida Grossman-Orr, LCSW 10/21/2015, 1:13 PM

## 2015-10-21 NOTE — Plan of Care (Signed)
Problem: Diagnosis: Increased Risk For Suicide Attempt Goal: STG-Patient Will Report Suicidal Feelings to Staff Outcome: Progressing Patient currently denies suicidal ideations.      

## 2015-10-21 NOTE — [facility] Group Notes (Signed)
[facility] Group Notes:  (Clinical Social Work)  10/21/2015  [facility] Group Notes:  (Clinical Social Work)  10/21/2015  11:00AM-12:00PM  Summary of Progress/Problems:  The main focus of today's process group was to listen to a variety of genres of music and to identify that different types of music provoke different responses.  The patient then was able to identify personally what was soothing for them, as well as energizing.  The patient expressed understanding of concepts, as well as knowledge of how each type of music affected him and how this can be used at home as a wellness/recovery tool.  Type of Therapy:  Music Therapy   Participation Level:  Active  Participation Quality:  Attentive and Sharing  Affect:  Blunted and Excited  Cognitive:  Oriented  Insight:  Engaged  Engagement in Therapy:  Engaged  Modes of Intervention:   Activity, Exploration  Ambrose MantleMareida Grossman-Orr, LCSW 10/21/2015

## 2015-10-21 NOTE — Progress Notes (Signed)
Adult Psychoeducational Group Note  Date:  10/21/2015 Time:  9:27 PM  Group Topic/Focus:  Wrap-Up Group:   The focus of this group is to help patients review their daily goal of treatment and discuss progress on daily workbooks.  Participation Level:  Active  Participation Quality:  Appropriate  Affect:  Appropriate  Cognitive:  Appropriate  Insight: Good  Engagement in Group:  Engaged  Modes of Intervention:  Activity  Additional Comments:  Patient rated his day a 10 and ultimate goal is work on and improve physical and mental health.  Natasha MeadKiara M McCain 10/21/2015, 9:27 PM

## 2015-10-21 NOTE — Progress Notes (Signed)
[facility] MD Progress Note  10/21/2015 1:10 PM Stephen [name]  MRN:  [id number] Subjective: Patient states " I am fine.  "   Objective:Patient is a 29 year old Hispanic male from [place], is employed , lives with a cousin in [place], who presented to [facility] S/P self inflicted wound to forearm - with a saw.  Patient seen today with translator and chart reviewed.  Patient doing somewhat better.  He had a good night's sleep.  He continued to endorse missing his family but he is not tearful .  He had contact with his cousin and he like to stay with his cousin until he had enough money to go back to [place].  Patient remains somewhat guarded and anxious but overall tolerating his medication without any side effects.  Sleep is improved.  He is going to the groups but he had limited participation.  Principal Problem: Major depressive disorder, recurrent episode, severe, with psychosis (HCC) Diagnosis:   Patient Active Problem List   Diagnosis Date Noted  . Hyperprolactinemia (HCC) [E22.1] 10/19/2015  . Major depressive disorder, recurrent episode, severe, with psychosis (HCC) [F33.3] 10/17/2015  . Tobacco use disorder [F17.200] 10/17/2015  . Cocaine use disorder, moderate, in early remission [F14.90] 10/17/2015  . Alcohol use disorder, mild, abuse [F10.10] 10/17/2015  . Laceration of wrist with complication [S61.519A] 10/06/2015   Total Time spent with patient: 15 minutes  Past Psychiatric History:Patient report prior admission to hospital at [facility]- was started on medications - does not know the name. Pt reports several suicide attempts in the past - cutting wrist , jumping of a tree - few months ago- required knee surgery. Pt is not compliant on medications  Past Medical History:  Past Medical History  Diagnosis Date  . Hearing voices     Pt does not know exactly what it is but he does hear voices    Past Surgical History  Procedure Laterality Date  . Fracture surgery Right 2016    right  lower leg - August   . Back surgery  August   . I&d extremity Right 10/05/2015    Procedure: IRRIGATION AND DEBRIDEMENT RIGHT WRIST; REPAIR OF MULTIPLE FLEXOR TENDONS; MEDIAL NERVE NEUROLYSIS;  Surgeon: Dominica Severin, MD;  Location: [facility] OR;  Service: Orthopedics;  Laterality: Right;   Family History:  Family History  Problem Relation Age of Onset  . Alcoholism Father    Family Psychiatric  History: Pt reports his father is an alcoholic. Pt denies any hx of mental illness, suicide in family.  Social History: Pt reports he is single , denies having any children. Pt currently lives with a cousin in [place]. Pt reports he used to work in [employer]. Pt reports hx of being in jail for DWI - few years ago.  History  Alcohol Use No     History  Drug Use  . Yes  . Special: Cocaine, Marijuana    Comment: using cocainwe and thc.  Stopped 4 months ago    Social History   Social History  . Marital Status: Single    Spouse Name: N/A  . Number of Children: N/A  . Years of Education: N/A   Social History Main Topics  . Smoking status: Former Smoker -- 0.25 packs/day for 5 years    Types: Cigarettes  . Smokeless tobacco: Never Used  . Alcohol Use: No  . Drug Use: Yes    Special: Cocaine, Marijuana     Comment: using cocainwe and thc.  Stopped 4 months ago  .  Sexual Activity: Yes    Birth Control/ Protection: None   Other Topics Concern  . None   Social History Narrative   Additional Social History:    History of alcohol / drug use?: Yes Name of Substance 1: etoh 1 - Age of First Use: 16 1 - Amount (size/oz): 40oz daily 1 - Frequency: daily 1 - Duration: many years 1 - Last Use / Amount: 4 months ago Name of Substance 2: cocaine 2 - Age of First Use: 22 2 - Amount (size/oz): unknown 2 - Frequency: unknown 2 - Duration: 2 years 2 - Last Use / Amount: 4 months ago Name of Substance 3: THC 3 - Age of First Use: 16 3 - Amount (size/oz): unknown 3 - Frequency: several times a  week 3 - Duration: years 3 - Last Use / Amount: 4 months              Sleep: Fair  Appetite:  Fair  Current Medications: Current Facility-Administered Medications  Medication Dose Route Frequency Provider Last Rate Last Dose  . acetaminophen (TYLENOL) tablet 650 mg  650 mg Oral Q6H PRN Kerry HoughSpencer E Simon, PA-C      . alum & mag hydroxide-simeth (MAALOX/MYLANTA) 200-200-20 MG/5ML suspension 30 mL  30 mL Oral Q4H PRN Kerry HoughSpencer E Simon, PA-C      . benztropine (COGENTIN) tablet 0.5 mg  0.5 mg Oral QHS Jomarie LongsSaramma Eappen, MD   0.5 mg at 10/20/15 2153  . FLUoxetine (PROZAC) capsule 40 mg  40 mg Oral Daily Saramma Eappen, MD   40 mg at 10/21/15 0903  . haloperidol (HALDOL) tablet 5 mg  5 mg Oral QHS Jomarie LongsSaramma Eappen, MD   5 mg at 10/20/15 2153  . hydrOXYzine (ATARAX/VISTARIL) tablet 25 mg  25 mg Oral Q6H PRN Kerry HoughSpencer E Simon, PA-C   25 mg at 10/16/15 2148  . ibuprofen (ADVIL,MOTRIN) tablet 600 mg  600 mg Oral Q6H PRN Kerry HoughSpencer E Simon, PA-C   600 mg at 10/20/15 1930  . risperiDONE (RISPERDAL M-TABS) disintegrating tablet 2 mg  2 mg Oral Q8H PRN Kerry HoughSpencer E Simon, PA-C       And  . LORazepam (ATIVAN) tablet 1 mg  1 mg Oral PRN Kerry HoughSpencer E Simon, PA-C       And  . ziprasidone (GEODON) injection 20 mg  20 mg Intramuscular PRN Kerry HoughSpencer E Simon, PA-C      . magnesium hydroxide (MILK OF MAGNESIA) suspension 30 mL  30 mL Oral Daily PRN Kerry HoughSpencer E Simon, PA-C      . nicotine polacrilex (NICORETTE) gum 2 mg  2 mg Oral PRN Jomarie LongsSaramma Eappen, MD   2 mg at 10/20/15 2155  . traZODone (DESYREL) tablet 50 mg  50 mg Oral QHS,MR X 1 Kerry HoughSpencer E Simon, PA-C   50 mg at 10/20/15 2311    Lab Results:  No results found for this or any previous visit (from the past 48 hour(s)).  Physical Findings: AIMS: Facial and Oral Movements Muscles of Facial Expression: None, normal Lips and Perioral Area: None, normal Jaw: None, normal Tongue: None, normal,Extremity Movements Upper (arms, wrists, hands, fingers): None, normal Lower  (legs, knees, ankles, toes): None, normal, Trunk Movements Neck, shoulders, hips: None, normal, Overall Severity Severity of abnormal movements (highest score from questions above): None, normal Incapacitation due to abnormal movements: None, normal Patient's awareness of abnormal movements (rate only patient's report): No Awareness, Dental Status Current problems with teeth and/or dentures?: No Does patient usually wear dentures?: No  CIWA:  COWS:     Musculoskeletal: Strength & Muscle Tone: within normal limits Gait & Station: normal Patient leans: N/A  Psychiatric Specialty Exam: Review of Systems  Psychiatric/Behavioral: Positive for depression. The patient is nervous/anxious.   All other systems reviewed and are negative.   Blood pressure 99/61, pulse 120, temperature 98.3 F (36.8 C), temperature source Oral, resp. rate 16, height  (1.6 m), weight 69.854 kg (154 lb).Body mass index is 27.29 kg/(m^2).  General Appearance: Casual  Eye Contact::  Fair  Speech:  Clear and Coherent  Volume:  Normal  Mood:  Anxious and Depressed  Affect:  Appropriate  Thought Process:  Goal Directed  Orientation:  Full (Time, Place, and Person)  Thought Content:  Rumination-  Suicidal Thoughts:  denies , but is S/P severe suicide attempt - cut his wrist with a saw.  Homicidal Thoughts:  No  Memory:  Immediate;   Fair Recent;   Fair Remote;   Fair  Judgement:  Impaired  Insight:  Shallow  Psychomotor Activity:  Normal  Concentration:  Poor  Recall:  [employer] of Knowledge:Fair  Language: Fair  Akathisia:  No  Handed:  Right  AIMS (if indicated):     Assets:  Desire for Improvement  ADL's:  Intact  Cognition: WNL  Sleep:  Number of Hours: 6   Treatment Plan Summary: Patient presented after a severe suicide attempt - this being his second suicide attempt in the past few months. Pt had cut his wrist with a saw- and required surgery and repair .   Daily contact with patient to  assess and evaluate symptoms and progress in treatment and Medication management Continue Prozac 40  mg po daily for affective sx. Will continue Haldol 5 mg po qhs for psychosis. Will continue  Cogentin 0.5 mg po qhs for EPS. Will continue Trazodone 50 mg po qhs for sleep. Will make available PRN medications as per agitation protocol.  Will continue to monitor vitals ,medication compliance and treatment side effects while patient is here.  Will monitor for medical issues as well as call consult as needed.  Will continue dressing as per recommendation from Orthopedics consult - as per recommendations - Will consider contacting Gramig MD - cell phone [phone]- IF NEEDED.   Reviewed labs- PL level high - likely 2/2 medications - will advise pt to follow up with out patient provider . CSW will start working on disposition. Pt to be referred for CBT once stable for discharge. Patient to participate in therapeutic milieu .   Arney Mayabb T. MD 10/21/2015, 1:10 PM

## 2015-10-21 NOTE — Progress Notes (Addendum)
D) Pt has been attending the groups and interacting with his peers. Has his interpreter, but does understand some english. Pt denies SI and HI, delusions and hallucinations. Is pleasant with interaction Pt rates his depression at a 0, hopelessness at a 0 and his anxiety at a 0 A) Pt given support and reassurance along with praise. R) Pt denies SI and HI.

## 2015-10-22 MED ORDER — HYDROXYZINE HCL 25 MG PO TABS
25.0000 mg | ORAL_TABLET | Freq: Three times a day (TID) | ORAL | Status: DC | PRN
  Filled 2015-10-22: qty 20

## 2015-10-22 MED ORDER — HALOPERIDOL 5 MG PO TABS: 5.0000 mg | ORAL_TABLET | Freq: Every day | ORAL | Status: AC

## 2015-10-22 MED ORDER — HYDROXYZINE HCL 25 MG PO TABS: 25.0000 mg | ORAL_TABLET | Freq: Three times a day (TID) | ORAL | Status: AC | PRN

## 2015-10-22 MED ORDER — TRAZODONE HCL 50 MG PO TABS: 50.0000 mg | ORAL_TABLET | Freq: Every evening | ORAL | Status: AC | PRN

## 2015-10-22 MED ORDER — FLUOXETINE HCL 40 MG PO CAPS: 40.0000 mg | ORAL_CAPSULE | Freq: Every day | ORAL | Status: AC

## 2015-10-22 MED ORDER — BENZTROPINE MESYLATE 0.5 MG PO TABS: 0.5000 mg | ORAL_TABLET | Freq: Every day | ORAL | Status: AC

## 2015-10-22 NOTE — [facility] Suicide Risk Assessment (Signed)
[facility] Discharge Suicide Risk Assessment   Demographic Factors:  Male  Total Time spent with patient: 30 minutes  Musculoskeletal: Strength & Muscle Tone: within normal limits Gait & Station: normal Patient leans: N/A  Psychiatric Specialty Exam: Physical Exam  Review of Systems  Psychiatric/Behavioral: Negative for depression, suicidal ideas and hallucinations. The patient is not nervous/anxious.   All other systems reviewed and are negative.   Blood pressure 100/62, pulse 76, temperature 98.2 F (36.8 C), temperature source Oral, resp. rate 16, height  (1.6 m), weight 69.854 kg (154 lb).Body mass index is 27.29 kg/(m^2).  General Appearance: Casual  Eye Contact::  Fair  Speech:  Clear and Coherent409  Volume:  Normal  Mood:  Euthymic  Affect:  Appropriate  Thought Process:  Coherent  Orientation:  Full (Time, Place, and Person)  Thought Content:  WDL  Suicidal Thoughts:  No  Homicidal Thoughts:  No  Memory:  Immediate;   Fair Recent;   Fair Remote;   Fair  Judgement:  Fair  Insight:  Fair  Psychomotor Activity:  Normal  Concentration:  Fair  Recall:  [employer] of Knowledge:Fair  Language: Fair  Akathisia:  No  Handed:  Right  AIMS (if indicated):     Assets:  Communication Skills Desire for Improvement  Sleep:  Number of Hours: 6  Cognition: WNL  ADL's:  Intact   Have you used any form of tobacco in the last 30 days? (Cigarettes, Smokeless Tobacco, Cigars, and/or Pipes): Yes  Has this patient used any form of tobacco in the last 30 days? (Cigarettes, Smokeless Tobacco, Cigars, and/or Pipes) Yes, A prescription for an FDA-approved tobacco cessation medication was offered at discharge and the patient refused  Mental Status Per Nursing Assessment::   On Admission:  Suicidal ideation indicated by patient  Current Mental Status by Physician: pt denies SI/HI/AH/VH  Loss Factors: AWAY FROM FAMILY IN [place] - HAS NOT SEEN THEM IN 11 YEARS.  Historical  Factors: Impulsivity  Risk Reduction Factors:   Living with another person, especially a relative  Continued Clinical Symptoms:  Depression:   Impulsivity Medical Diagnoses and Treatments/Surgeries  Cognitive Features That Contribute To Risk:  None    Suicide Risk:  Minimal: No identifiable suicidal ideation.  Patients presenting with no risk factors but with morbid ruminations; may be classified as minimal risk based on the severity of the depressive symptoms  Principal Problem: Major depressive disorder, recurrent episode, severe, with psychosis (HCC) ( IMPROVED) Discharge Diagnoses:  Patient Active Problem List   Diagnosis Date Noted  . Hyperprolactinemia (HCC) [E22.1] 10/19/2015  . Major depressive disorder, recurrent episode, severe, with psychosis (HCC) [F33.3] 10/17/2015  . Tobacco use disorder [F17.200] 10/17/2015  . Cocaine use disorder, moderate, in early remission [F14.90] 10/17/2015  . Alcohol use disorder, mild, abuse [F10.10] 10/17/2015  . Laceration of wrist with complication [S61.519A] 10/06/2015    Follow-up Information    Follow up with [facility].   Why:  Because pt is undocumented he will not be able to recieve services with Mental Health. Toniann Fail is his care coordinaor and will follow-up with him.   Contact information:   [phone]      Plan Of Care/Follow-up recommendations:  Activity:  No restrictions Diet:  regular Tests:  Follow up on Prolactin level on an out pt basis Other:  none  Is patient on multiple antipsychotic therapies at discharge:  No   Has Patient had three or more failed trials of antipsychotic monotherapy by history:  No  Recommended Plan for Multiple Antipsychotic Therapies: NA    Kahlea Cobert MD 10/22/2015, 10:17 AM

## 2015-10-22 NOTE — Progress Notes (Signed)
Patient discharged home with samples and prescriptions. Patient was stable and appreciative at that time. All papers and prescriptions were given and valuables returned. Verbal understanding expressed through the interpreter. Denies SI/HI and A/VH. Patient given opportunity to express concerns and ask questions.

## 2015-10-22 NOTE — Progress Notes (Signed)
  [facility] :  Will you be returning to the same living situation after discharge:  No. Friend Stephen [name] At discharge, do you have transportation home?: Yes,  friend Do you have the ability to pay for your medications: No. We are sending with 30 day supply  Release of information consent forms completed and in the chart;  Patient's signature needed at discharge.  Patient to Follow up at: Follow-up Information    Follow up with Stephen [name].   Why:  Because pt is undocumented he will not be able to receive services with Mental Health. Stephen [name] is his care coordinator and will follow-up with him.   Contact information:   [phone]      Next level of care provider has [facility] to [facility] Link:no  Patient denies SI/HI: Yes,  yes    Safety Planning and Suicide Prevention discussed: Yes,  yes  Have you used any form of tobacco in the last 30 days? (Cigarettes, Smokeless Tobacco, Cigars, and/or Pipes): Yes  Has patient been referred to the Quitline?: N/A patient is not a smoker  Stephen [name] 10/22/2015, 10:22 AM

## 2015-10-22 NOTE — Tx Team (Signed)
Interdisciplinary Treatment Plan Update (Adult)  Date:  10/22/2015 Time Reviewed:  8:19 AM  Progress in Treatment: Attending groups: Yes. Participating in groups: Yes. Taking medication as prescribed:  Yes. Tolerating medication:  Yes. Family/Significant othe contact made:  Yes Patient understands diagnosis:  Yes, as evidenced by seeking help with SI and depression. Discussing patient identified problems/goals with staff:  Yes, see initial care plan. Medical problems stabilized or resolved:  Yes Denies suicidal/homicidal ideation: Yes. Issues/concerns per patient self-inventory: No. Other:  New problem(s) identified:   Discharge Plan or Barriers: See below  Reason for Continuation of Hospitalization:   Comments: Patient is a 29 year old Hispanic male from [place]. Interview was conducted via an interpreter. He states that he has been in the [place] for 8 or 9 years. He's currently not working. He states that his entire family lives in [place] and he only has 1 or 2 friends here. He states that he's had mental illness for several months. He wanted to kill himself by cutting himself with a saw and he injured his right wrist. A few months ago he tried to kill himself by jumping out of a tree and ended up with knee surgery to his right knee. He was hospitalized in the hospital helped him pay for a couple of months at a shelter which she claims is in [place]. The patient states that he hears voices telling him that he is either a devil or an angel. The voices are telling him to "either kill myself or to get married." Prozac, Haldol, Desyrel trail  Estimated length of stay: D/C today  New goal(s):  Review of initial/current patient goals per problem list:  1. Goal(s): Patient will participate in aftercare plan  Met: Yes   Target date: at discharge  As evidenced by: Patient will participate within aftercare plan AEB aftercare provider and housing plan at discharge being  identified.  10/17/15: Discharge plan unknown. CSW still assessing. 10/22/15:  Stay with friend Kittie Plater, follow up with care coordinator Stigler [id number]  2. Goal (s): Patient will exhibit decreased depressive symptoms and suicidal ideations.  Met:Yes  Target date: at discharge  As evidenced by: Patient will utilize self rating of depression at 3 or below and demonstrate decreased signs of depression or be deemed stable for discharge by MD.  10/17/15: Pt reported to the hospital for SI after a recent suicide attempt. Pt currently denies symptoms but is still a high suicide risk. Will continue to assess. 10/22/15:  Pt denies SI, depression today.   4. Goal(s): Patient will demonstrate decreased signs of psychosis.  Met: Yes  Target date:at discharge  As evidenced by: Patient will demonstrate decreased signs of psychosis as evidenced by a reduction in AVH, paranoia, and/or delusions.   10/17/15: When pt was admitted to the hospital pt reported that he was hearing voices that were telling him to kill himself. Pt currently denies AVH but CSW will continue to assess.  Willing to try medication. 10/22/15:  No signs nor symptoms of psychosis today.   Attendees: Patient:  10/22/2015 8:19 AM  Family:   10/22/2015 8:19 AM  Physician:  Dr. Ursula Alert, MD 10/22/2015 8:19 AM  Nursing: Manuella Ghazi, RN 10/22/2015 8:19 AM  Case Manager:  Roque Lias, LCSW 10/22/2015 8:19 AM  Counselor:  Matthew Saras, MSW Intern 10/22/2015 8:19 AM  Other:   10/22/2015 8:19 AM  Other:   10/22/2015 8:19 AM  Other:   10/22/2015 8:19 AM  Other:  10/22/2015 8:19 AM  Other:  Other:    Other:    Other:    Other:    Other:      Scribe for Treatment Team:   Georga Kaufmann, MSW Intern 10/22/2015 8:19 AM

## 2015-10-22 NOTE — Discharge Summary (Signed)
Physician Discharge Summary Note  Patient:  Stephen [name] is an 29 y.o., male MRN:  [id number] DOB:  02/23/1986 Patient phone:  [phone] (home)  Patient address:   [address]  [place] [id number],  Total Time spent with patient: 30 minutes  Date of Admission:  10/16/2015 Date of Discharge: 10/22/2015  Reason for Admission-PER Admission Note-Patient is a 29 year old Hispanic male from [place], is employed , lives [place] a [place], who [facility] self inflicted wound to forearm - with a chain saw.  pt was seen by Orthopedic consult team - pt underwent repair and suture . Pt thereafter was seen by Psychiatry consult team - and per notes in EHR "He states that his entire family lives in [place] and he only has 1 or 2 friends here. He states that he's had mental illness for several months. He wanted to kill himself by cutting himself with a saw and he injured his right wrist. A few months ago he tried to kill himself by jumping out of a tree and ended up with knee surgery to his right knee. He was hospitalized in the hospital helped him pay for a couple of months at a shelter which she claims is in [place]. The patient states that he hears voices telling him that he is either a devil or an angel. The voices are telling him to "either kill myself or to get married." He is obviously responding to internal stimuli. He is refusing his antibiotic medication. He was repeatedly told this was to help him prevent infection but he claims he doesn't need it and this is not the real problem. He states that the real problem is his mental illness. He has been depressed sad ruminating about how to kill himself. He also states he wants us to deport him and get him back into [place]. He states he has been on some form of medication for mental illness but he doesn't know the name of the medicines and he states they're not helping. He denies ever being in a psychiatric hospital  before. The patient became very agitated right before I got to the room tore out his IV and was trying to leave and security had to be called. He is under IVC."  Principal Problem: Major depressive disorder, recurrent episode, severe, with psychosis [facility](HCC) Discharge Diagnoses: Patient Active Problem List   Diagnosis Date Noted  . Hyperprolactinemia (HCC) [E22.1] 10/19/2015  . Major depressive disorder, recurrent episode, severe, with psychosis (HCC) [F33.3] 10/17/2015  . Tobacco use disorder [F17.200] 10/17/2015  . Cocaine use disorder, moderate, in early remission [F14.90] 10/17/2015  . Alcohol use disorder, mild, abuse [F10.10] 10/17/2015  . Laceration of wrist with complication [S61.519A] 10/06/2015    Past Psychiatric History: SEE SRA  Past Medical History:  Past Medical History  Diagnosis Date  . Hearing voices     Pt does not know exactly what it is but he does hear voices    Past Surgical History  Procedure Laterality Date  . Fracture surgery Right 2016    right lower leg - August   . Back surgery  August   . I&d extremity Right 10/05/2015    Procedure: IRRIGATION AND DEBRIDEMENT RIGHT WRIST; REPAIR OF MULTIPLE FLEXOR TENDONS; MEDIAL NERVE NEUROLYSIS;  Surgeon: Dominica SeverinWilliam Gramig, MD;  Location: [facility] OR;  Service: Orthopedics;  Laterality: Right;   Family History:  Family History  Problem Relation Age of Onset  . Alcoholism Father    Family Psychiatric  History: SEE SRA Social History:  History  Alcohol Use No     History  Drug Use  . Yes  . Special: Cocaine, Marijuana    Comment: using cocainwe and thc.  Stopped 4 months ago    Social History   Social History  . Marital Status: Single    Spouse Name: N/A  . Number of Children: N/A  . Years of Education: N/A   Social History Main Topics  . Smoking status: Former Smoker -- 0.25 packs/day for 5 years    Types: Cigarettes  . Smokeless tobacco: Never Used  . Alcohol Use: No  . Drug Use: Yes    Special: Cocaine,  Marijuana     Comment: using cocainwe and thc.  Stopped 4 months ago  . Sexual Activity: Yes    Birth Control/ Protection: None   Other Topics Concern  . None   Social History Narrative    Hospital Course:  Stephen [name] was admitted for Major depressive disorder, recurrent episode, severe, with psychosis (HCC) , and crisis management.  Pt was treated discharged with the medications listed below under Medication List.  Medical problems were identified and treated as needed.  Home medications were restarted as appropriate.  Improvement was monitored by observation and Stephen [name] 's daily report of symptom reduction.  Emotional and mental status was monitored by daily self-inventory reports completed by Stephen [name] and clinical staff.         Stephen [name] was evaluated by the treatment team for stability and plans for continued recovery upon discharge. Stephen [name] 's motivation was an integral factor for scheduling further treatment. Employment, transportation, bed availability, health status, family support, and any pending legal issues were also considered during hospital stay. Pt was offered further treatment options upon discharge including but not limited to Residential, Intensive Outpatient, and Outpatient treatment.  Stephen [name] will follow up with the services as listed below under Follow Up Information.     Upon completion of this admission the patient was both mentally and medically stable for discharge denying suicidal/homicidal ideation, auditory/visual/tactile hallucinations, delusional thoughts and paranoia.    Stephen [name] responded well to treatment with Cogentin 0.5mg , Prozac 40mg , Haldol 5mg , and Trazodone 50 mg without adverse effects. Pt demonstrated improvement without reported or observed adverse effects to the point of stability appropriate for outpatient management. Pertinent labs include: , Prolactin 65.2 (high), for which outpatient follow-up  is necessary for lab recheck as mentioned below. Reviewed CBC, CMP, BAL, and UDS; all unremarkable aside from noted exceptions.   Physical Findings: AIMS: Facial and Oral Movements Muscles of Facial Expression: None, normal Lips and Perioral Area: None, normal Jaw: None, normal Tongue: None, normal,Extremity Movements Upper (arms, wrists, hands, fingers): None, normal Lower (legs, knees, ankles, toes): None, normal, Trunk Movements Neck, shoulders, hips: None, normal, Overall Severity Severity of abnormal movements (highest score from questions above): None, normal Incapacitation due to abnormal movements: None, normal Patient's awareness of abnormal movements (rate only patient's report): No Awareness, Dental Status Current problems with teeth and/or dentures?: No Does patient usually wear dentures?: No  CIWA:    COWS:     Musculoskeletal: Strength & Muscle Tone: within normal limits Gait & Station: normal Patient leans: N/A  Psychiatric Specialty Exam: SEE SRA BY MD Review of Systems  Psychiatric/Behavioral: Negative for suicidal ideas and hallucinations. Depression: stable /Per translator Kathlene November  Nervous/anxious: stable.   All other systems reviewed and are negative.   Blood pressure 100/62, pulse 76, temperature 98.2  F (36.8 C), temperature source Oral, resp. rate 16, height  (1.6 m), weight 69.854 kg (154 lb).Body mass index is 27.29 kg/(m^2).  Have you used any form of tobacco in the last 30 days? (Cigarettes, Smokeless Tobacco, Cigars, and/or Pipes): Yes  Has this patient used any form of tobacco in the last 30 days? (Cigarettes, Smokeless Tobacco, Cigars, and/or Pipes) No  Metabolic Disorder Labs:  No results found for: HGBA1C, MPG Lab Results  Component Value Date   PROLACTIN 65.2* 10/18/2015   No results found for: CHOL, TRIG, HDL, CHOLHDL, VLDL, LDLCALC  See Psychiatric Specialty Exam and Suicide Risk Assessment completed by Attending Physician prior to  discharge.  Discharge destination:  Home  Is patient on multiple antipsychotic therapies at discharge:  No   Has Patient had three or more failed trials of antipsychotic monotherapy by history:  No  Recommended Plan for Multiple Antipsychotic Therapies: NA  Discharge Instructions    Activity as tolerated - No restrictions    Complete by:  As directed      Diet general    Complete by:  As directed      Discharge instructions    Complete by:  As directed   Take all of you medications as prescribed by your mental healthcare provider.  Report any adverse effects and reactions from your medications to your outpatient provider promptly.  Do not engage in alcohol and or illegal drug use while on prescription medicines. Keep all scheduled appointments. This is to ensure that you are getting refills on time and to avoid any interruption in your medication.  If you are unable to keep an appointment call to reschedule.  Be sure to follow up with resources and follow ups given. In the event of worsening symptoms call the crisis hotline, 911, and or go to the nearest emergency department for appropriate evaluation and treatment of symptoms. Follow-up with your primary care provider for your medical issues, concerns and or health care needs.            Medication List    TAKE these medications      Indication   benztropine 0.5 MG tablet  Commonly known as:  COGENTIN  Take 1 tablet (0.5 mg total) by mouth at bedtime.   Indication:  Extrapyramidal Reaction caused by Medications     FLUoxetine 40 MG capsule  Commonly known as:  PROZAC  Take 1 capsule (40 mg total) by mouth daily.   Indication:  mood stabilization     haloperidol 5 MG tablet  Commonly known as:  HALDOL  Take 1 tablet (5 mg total) by mouth at bedtime.   Indication:  mood stabilization     hydrOXYzine 25 MG tablet  Commonly known as:  ATARAX/VISTARIL  Take 1 tablet (25 mg total) by mouth every 8 (eight) hours as needed for  anxiety.   Indication:  Anxiety Neurosis     traZODone 50 MG tablet  Commonly known as:  DESYREL  Take 1 tablet (50 mg total) by mouth at bedtime and may repeat dose one time if needed.   Indication:  Trouble Sleeping           Follow-up Information    Follow up with [facility].   Why:  Because pt is undocumented he will not be able to receive services with Mental Health. Toniann Fail is his care coordinator and will follow-up with him.   Contact information:   [phone]      Follow up with [facility]  Higinio Plan, MD. Go in 1 week.   Specialty:  Orthopedic Surgery   Why:  FOR FOLLOW UP ON YOUR FOREARM - s/p surgery.   Contact information:   [address] [place] [address] [phone]       Follow-up recommendations:  Activity:  as tolerated Diet:  heart healthy  Comments:  Take all medications as prescribed. Keep all follow-up appointments as scheduled.  Do not consume alcohol or use illegal drugs while on prescription medications. Report any adverse effects from your medications to your primary care provider promptly.  In the event of recurrent symptoms or worsening symptoms, call 911, a crisis hotline, or go to the nearest emergency department for evaluation.   Signed: Oneta Rack FNP-BC 10/22/2015, 10:54 AM

## 2015-10-22 NOTE — [facility] Suicide Risk Assessment (Signed)
[facility] INPATIENT:  Family/Significant Other Suicide Prevention Education  Suicide Prevention Education:  Education Completed; June LeapMiguel Hopf, cousin, [phone]  has been identified by the patient as the family member/significant other with whom the patient will be residing, and identified as the person(s) who will aid the patient in the event of a mental health crisis (suicidal ideations/suicide attempt).  With written consent from the patient, the family member/significant other has been provided the following suicide prevention education, prior to the and/or following the discharge of the patient.  The suicide prevention education provided includes the following:  Suicide risk factors  Suicide prevention and interventions  National Suicide Hotline telephone number  [facility] assessment telephone number  [facility] Emergency Assistance 911  [facility] and/or Residential Mobile Crisis Unit telephone number  Request made of family/significant other to:  Remove weapons (e.g., guns, rifles, knives), all items previously/currently identified as safety concern.    Remove drugs/medications (over-the-counter, prescriptions, illicit drugs), all items previously/currently identified as a safety concern.  The family member/significant other verbalizes understanding of the suicide prevention education information provided.  The family member/significant other agrees to remove the items of safety concern listed above.  Daryel Geraldorth, Itzia Cunliffe B 10/22/2015, 11:48 AM

## 2015-10-23 NOTE — Discharge Summary (Signed)
  Please see chart notes.  Patient was admitted overnight after reconstruction of tendon nerve sensation structures from a table saw injury.  The patient admitted to trying to kill himself. We awaited psychiatric services and thus was admitted overnight. This turn into a very long admission as there is no place for him to go. Thus he was under my service and care during his hospitalization. He tolerated his diet he had normal voiding and bowel movement patterns. He was seen by psychiatry and his mood stabilize. He was ultimately found a behavioral unit which would except his inpatient care and was transferred.  At time of discharge his sutures were removed he was stable alert and oriented and had no problematic features in regards to his right arm.  Final diagnosis #1 table saw injury right wrist requiring irrigation debridement and tendon as well as nerve repair #2 suicide attempt #3 please see psychiatric evaluation  Terrisa Curfman M.D.

## 2016-06-30 IMAGING — DX DG WRIST COMPLETE 3+V*R*
4 series · 4 of 4 positions shown · non-contrast
Comparison: None.

CLINICAL DATA: Laceration, right wrist pain

EXAM:
RIGHT WRIST - COMPLETE 3+ VIEW

[x wrist pa right]
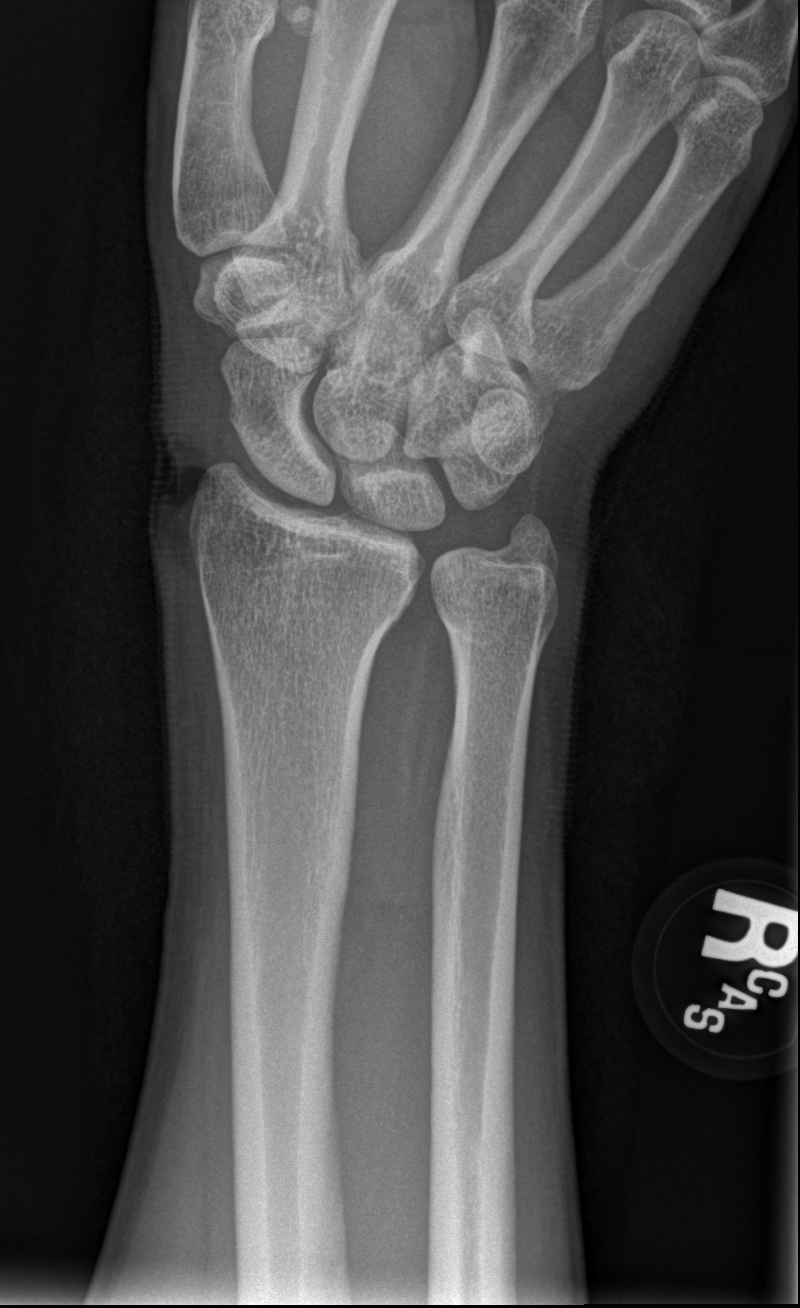

[x wrist obl right]
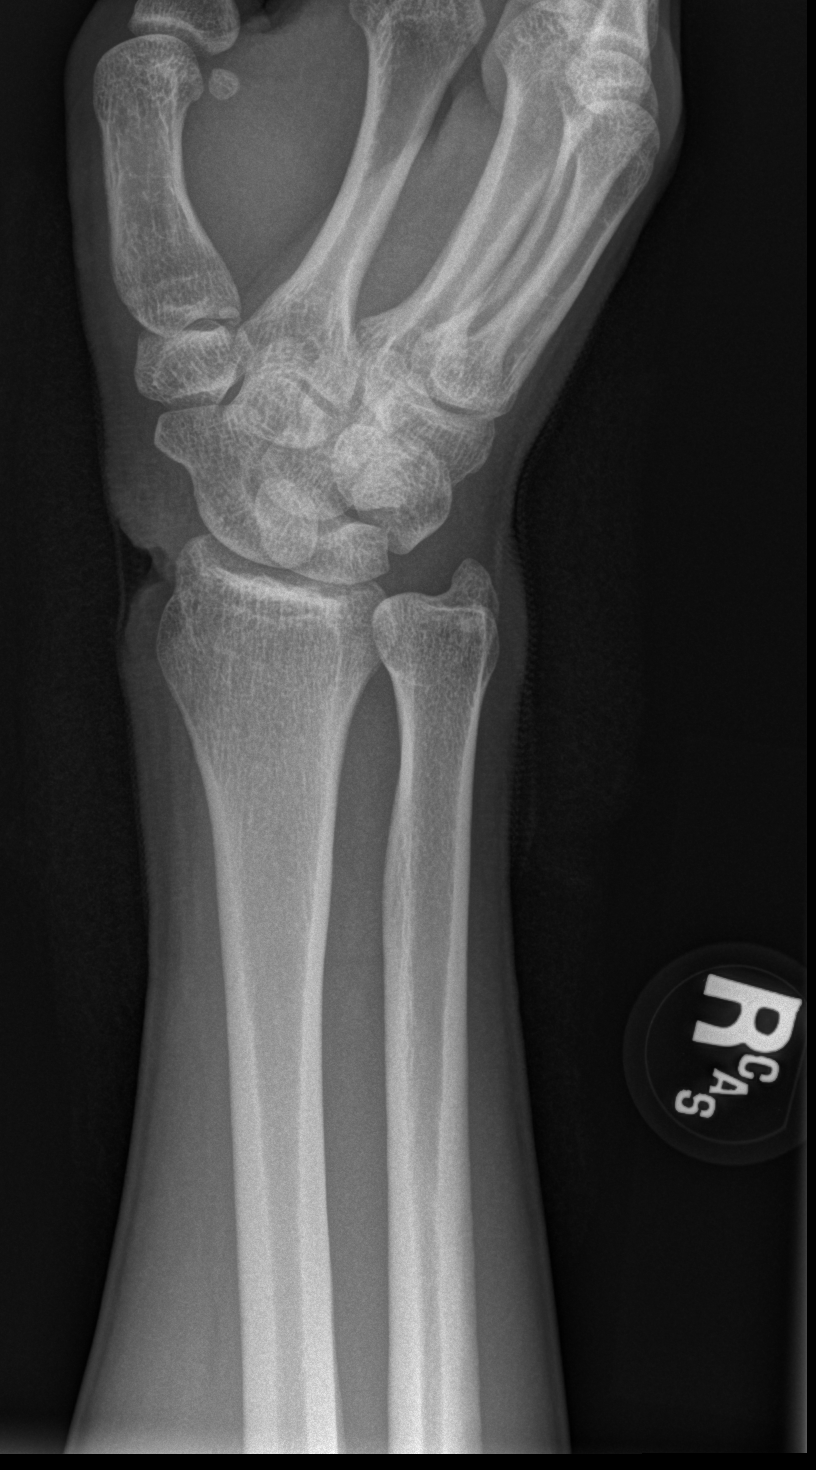

[x wrist lat right]
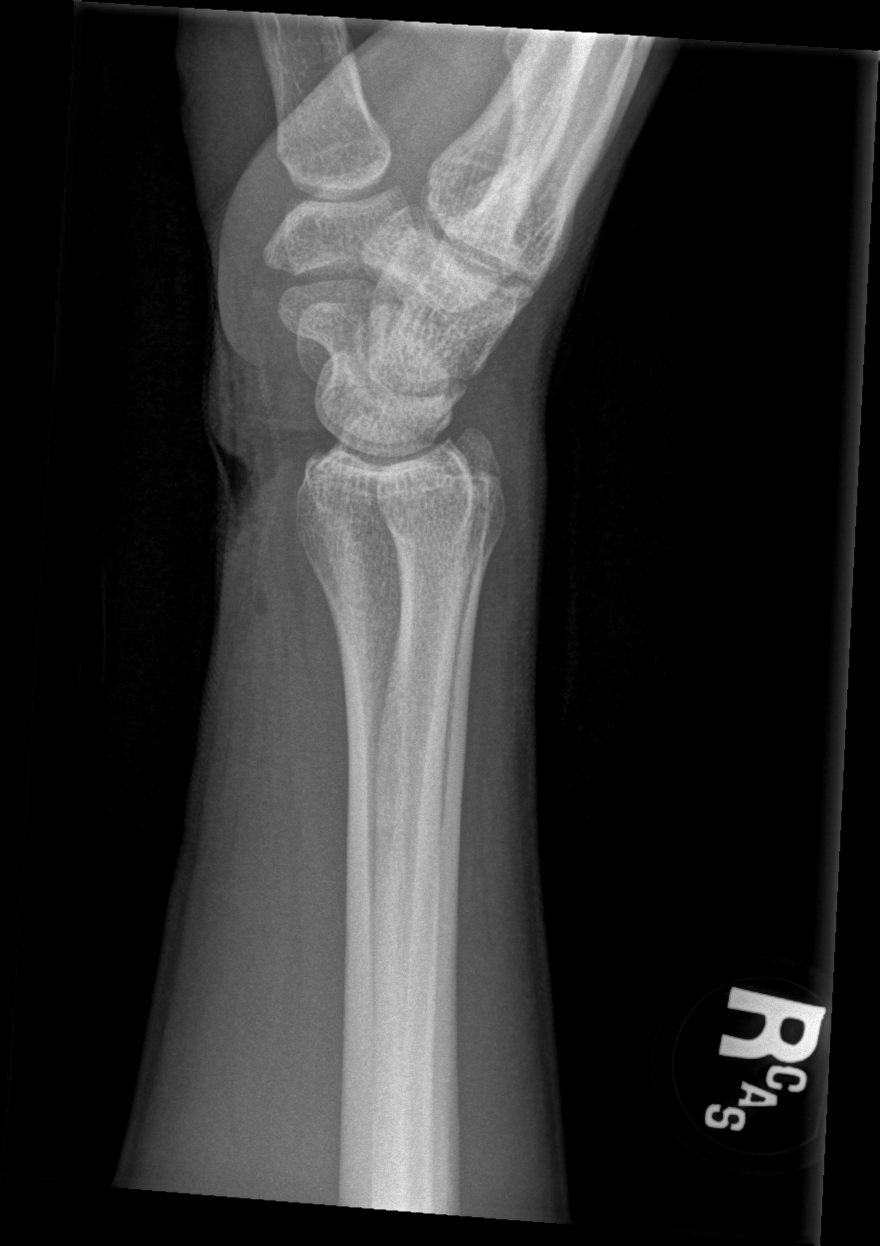

[x wrist navicular view right]
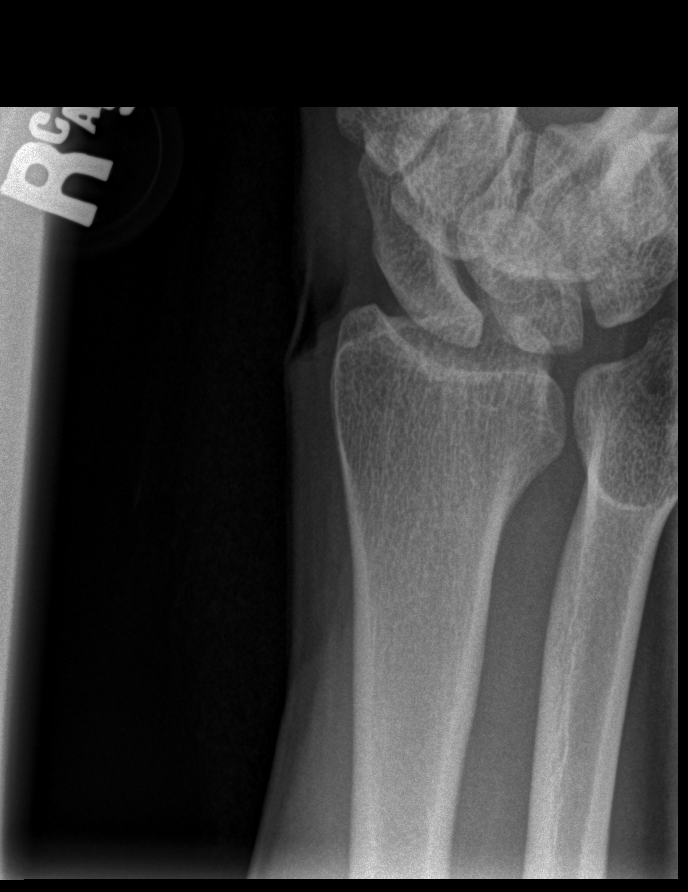

[4 of 4 positions shown; findings below may reference images not displayed]

FINDINGS: Four views of the right wrist submitted. No acute fracture or
subluxation. There is soft tissue air and skin defect probable
laceration radial aspect of the wrist.
IMPRESSION: No acute fracture or subluxation. Soft tissue injury probable
laceration radial aspect of the wrist
